# Patient Record
Sex: Male | Born: 1975 | Race: White | Hispanic: No | Marital: Married | Smoking: Never smoker
Health system: Southern US, Community
[De-identification: ages and names within clinical notes are randomized; demographics above are authoritative.]

---

## 2007-04-07 ENCOUNTER — Ambulatory Visit: Payer: Self-pay | Admitting: Internal Medicine

## 2008-01-03 ENCOUNTER — Emergency Department: Payer: Self-pay | Admitting: Emergency Medicine

## 2008-01-03 ENCOUNTER — Other Ambulatory Visit: Payer: Self-pay

## 2008-01-14 ENCOUNTER — Emergency Department: Payer: Self-pay | Admitting: Internal Medicine

## 2008-11-20 IMAGING — CT CT STONE STUDY
1 of 2 series · 16 of 32 positions shown, 20 images · non-contrast
Comparison: none

REASON FOR EXAM: l flank pain
COMMENTS:

[Series 2: stone · axial · 0.63mm/px · z∈[-754,-346]mm · 16 of 148 slices shown, 20 images]
[im 6/148  soft-tissue]
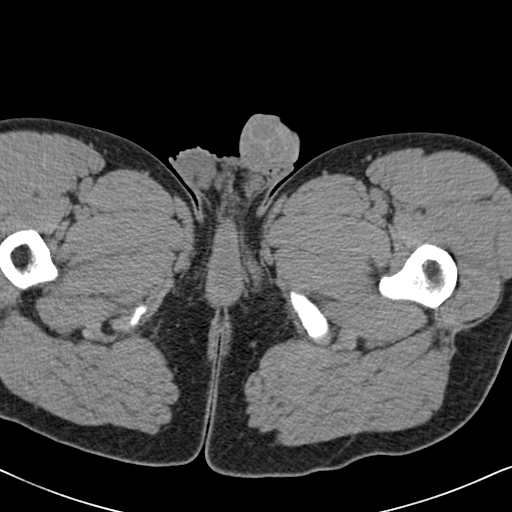
[im 6/148  bone]
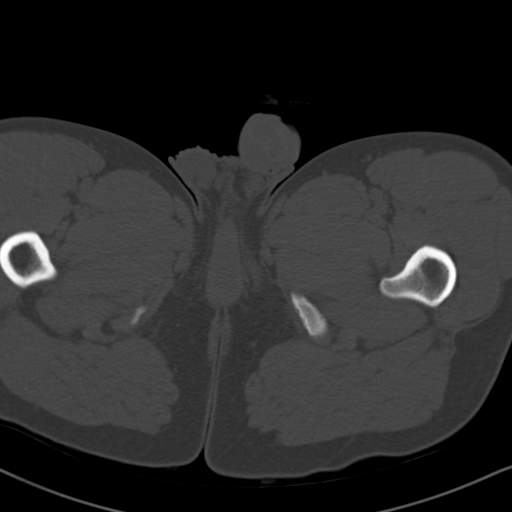
[im 17/148  soft-tissue]
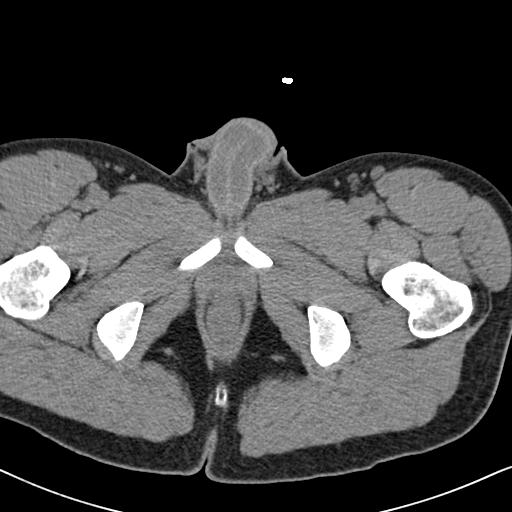
[im 28/148  soft-tissue]
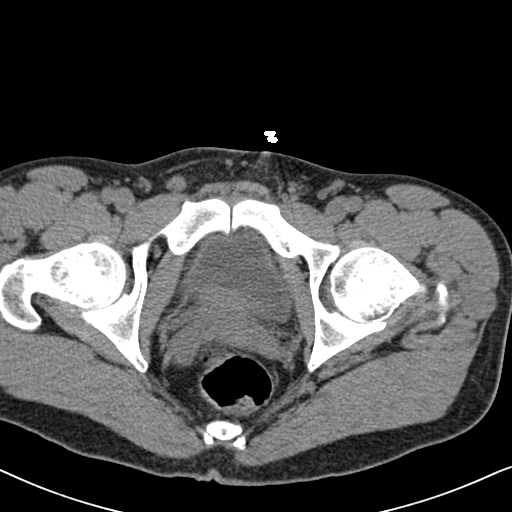
[im 39/148  soft-tissue]
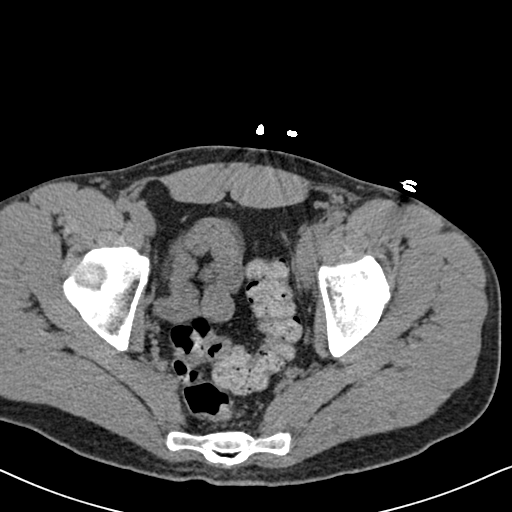
[im 50/148  soft-tissue]
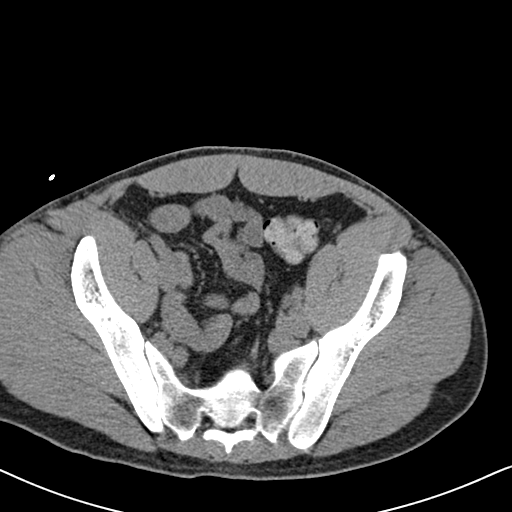
[im 60/148  soft-tissue]
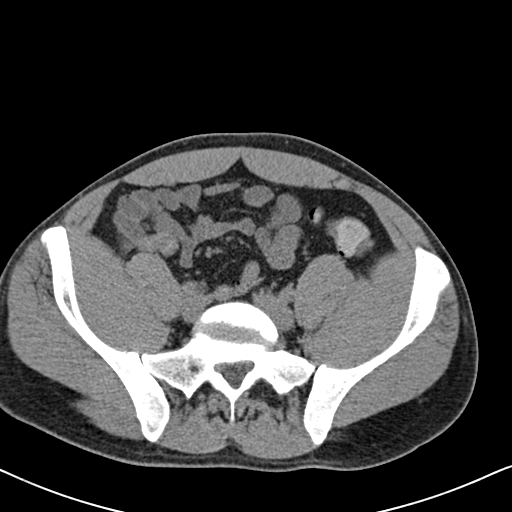
[im 71/148  soft-tissue]
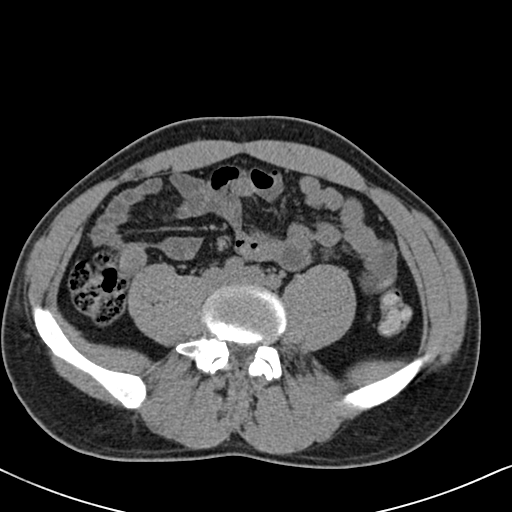
[im 77/148  soft-tissue]
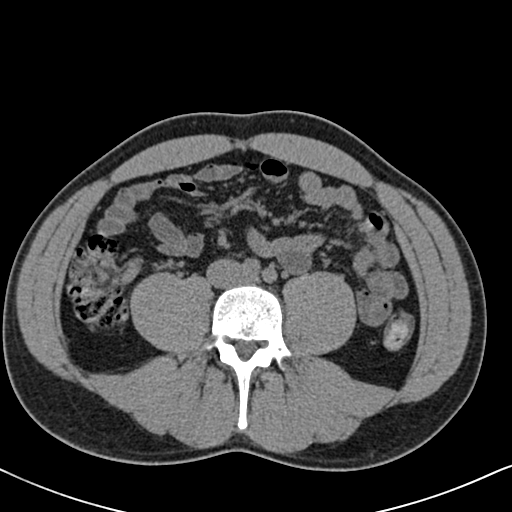
[im 88/148  soft-tissue]
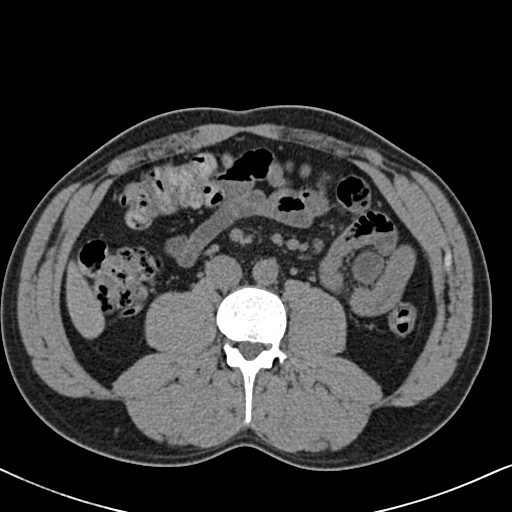
[im 88/148  bone]
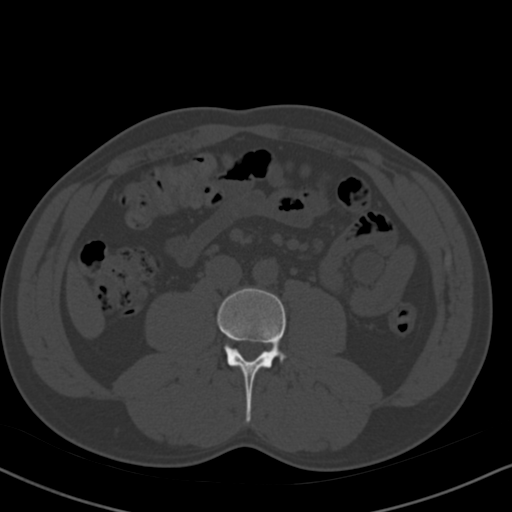
[im 99/148  soft-tissue]
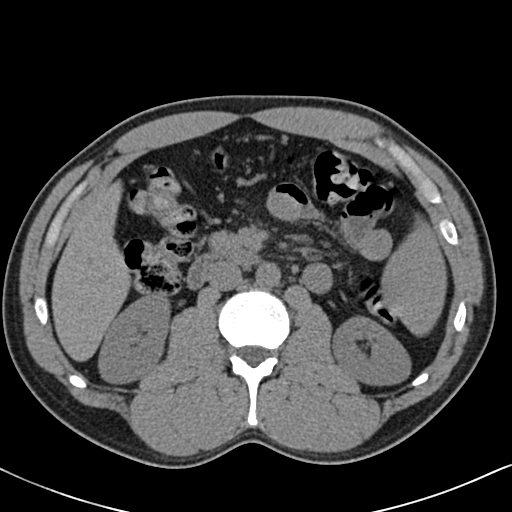
[im 109/148  soft-tissue]
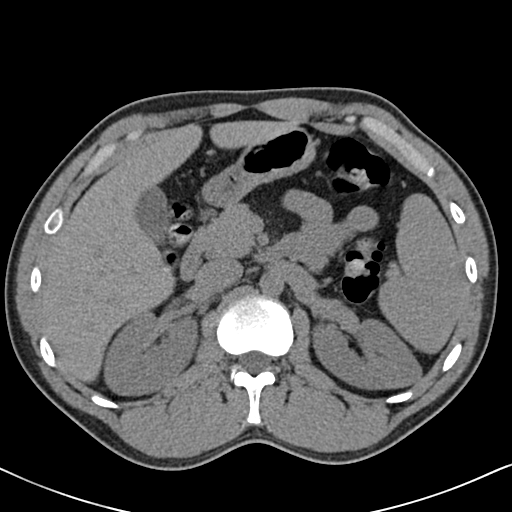
[im 120/148  soft-tissue]
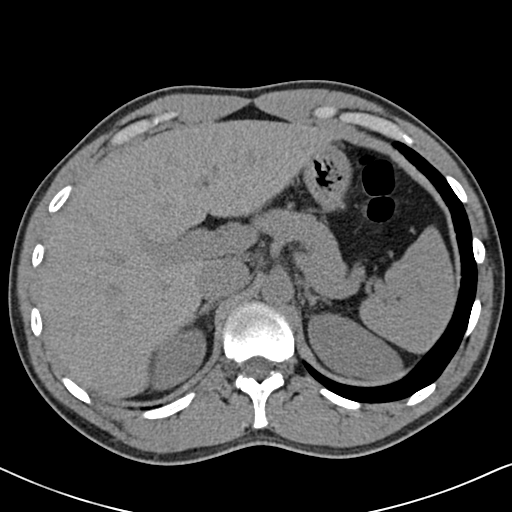
[im 126/148  lung]
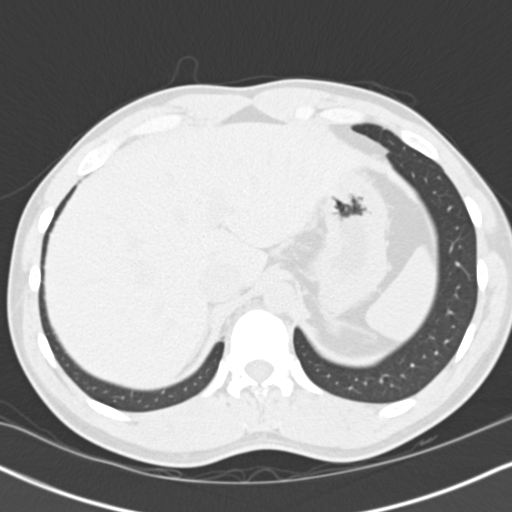
[im 131/148  soft-tissue]
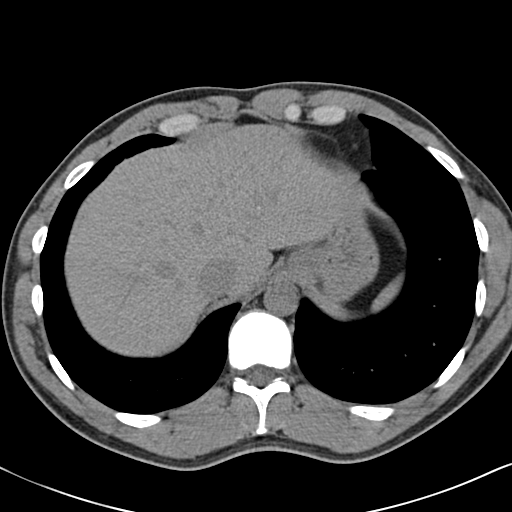
[im 131/148  lung]
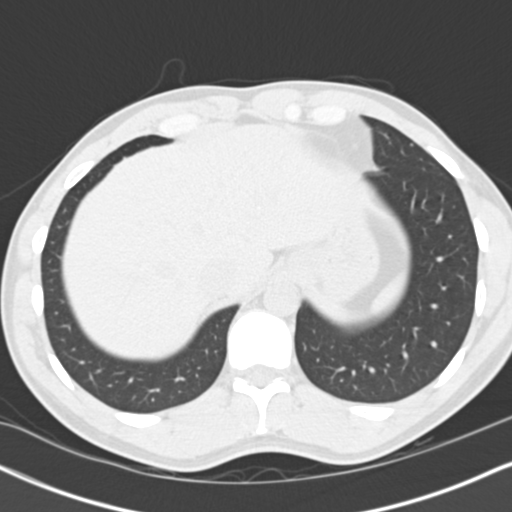
[im 137/148  lung]
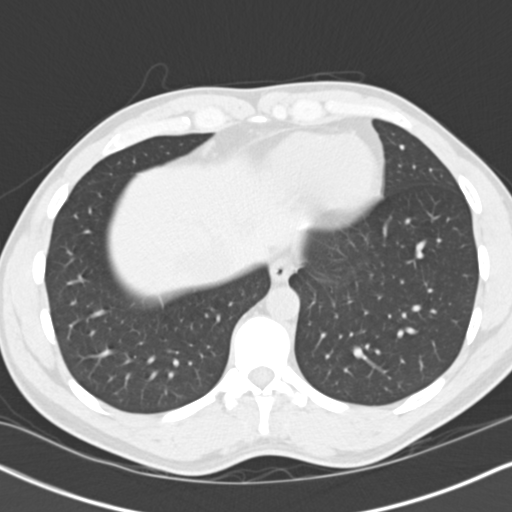
[im 142/148  soft-tissue]
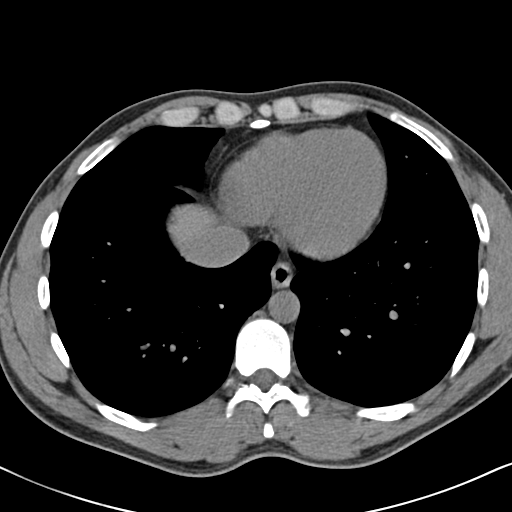
[im 142/148  lung]
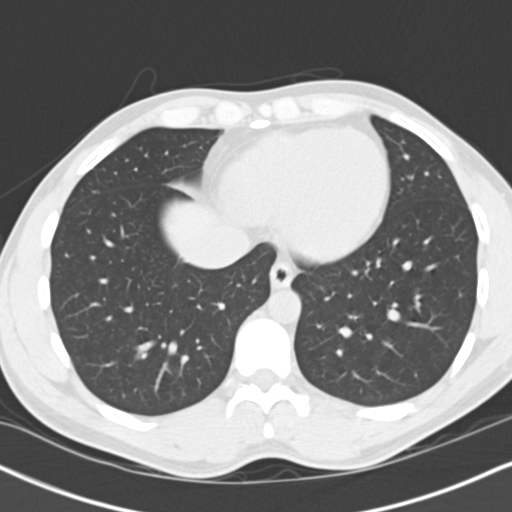

[16 of 32 positions shown; findings below may reference images not displayed]

PROCEDURE:     CT  - CT ABDOMEN /PELVIS WO (STONE)  - January 14, 2008  [DATE]

RESULT:     Noncontrast emergent CT of the abdomen and pelvis is performed.
There is no previous examination for comparison. A renal stone protocol was
followed. There is no urinary stone evident. There is no urinary
obstruction. There is no acute inflammation. The abdominal viscera appear to
be unremarkable. There is no adenopathy. The aorta is normal in caliber.
There is no abnormal bowel distention, free fluid or free air. The lung
bases are clear.
IMPRESSION: 1. No urinary stones or urinary obstruction.
2. No acute inflammation. The appendix is normal.

## 2019-08-18 ENCOUNTER — Other Ambulatory Visit: Payer: Self-pay

## 2019-08-18 ENCOUNTER — Encounter: Payer: Self-pay | Admitting: Urology

## 2019-08-18 ENCOUNTER — Ambulatory Visit (INDEPENDENT_AMBULATORY_CARE_PROVIDER_SITE_OTHER): Payer: No Typology Code available for payment source | Admitting: Urology

## 2019-08-18 VITALS — BP 140/95 | HR 101 | Ht 70.0 in | Wt 195.0 lb

## 2019-08-18 DIAGNOSIS — Z3009 Encounter for other general counseling and advice on contraception: Secondary | ICD-10-CM

## 2019-08-18 MED ORDER — DIAZEPAM 5 MG PO TABS: 5.0000 mg | ORAL_TABLET | Freq: Once | ORAL | 0 refills | Status: DC | PRN

## 2019-08-18 NOTE — Patient Instructions (Signed)
Pre-Vasectomy Instructions  STOP all aspirin or blood thinners (Aspirin, Plavix, Coumadin, Warfarin, Motrin, Ibuprofen, Advil, Aleve, Naproxen, Naprosyn) for 7 days prior to the procedure.  If you have any questions about stopping these medications please contact your primary care physician or cardiologist.  Shave all hair from the upper scrotum on the day of the procedure.  This means just under the penis onto the scrotal sac.  The area shaved should measure about 2-3 inches around.  You may lather the scrotum with soap and water, and shave with a safety razor.  After shaving the area, thoroughly wash the penis and the scrotum, then shower or bathe to remove all the loose hairs.  If needed, wash the area again just before coming in for your circumcision.  It is recommended to have a light meal an hour or so prior to the procedure.  Bring a scrotal support (jock strap or suspensory, or tight jockey shorts or underwear).  Wear comfortable pants or shorts.  While the actual procedure usually takes about 45 minutes, you should be prepared to stay in the office for approximately one hour.  Bring someone with you to drive you home.  If you have any questions or concerns, please feel free to call the office at [phone].      Vasectomy Vasectomy is a procedure in which the tube that carries sperm from the testicle to the urethra (vas deferens) is tied. It may also be cut. The procedure blocks sperm from going through the vas deferens and penis during ejaculation. This ensures that sperm does not go into the vagina during sex. Vasectomy does not affect your sexual desire or performance, and does not prevent sexually transmitted diseases. Vasectomy is considered a permanent and very effective form of birth control (contraception). The decision to have a vasectomy should not be made during a stressful situation, such as after the loss of a pregnancy or a divorce. You and your partner should make  the decision to have a vasectomy when you are sure that you do not want children in the future. Tell a health care provider about:  Any allergies you have.  All medicines you are taking, including vitamins, herbs, eye drops, creams, and over-the-counter medicines.  Any problems you or family members have had with anesthetic medicines.  Any blood disorders you have.  Any surgeries you have had.  Any medical conditions you have. What are the risks? Generally, this is a safe procedure. However, problems may occur, including:  Infection.  Bleeding and swelling of the scrotum.  Allergic reactions to medicines.  Failure of the procedure to prevent pregnancy. There is a very small chance that the cut ends of the vas deferens may reconnect (recanalization), meaning that you could still make a woman pregnant.  Pain in the scrotum that continues after healing from the procedure. What happens before the procedure?  Ask your health care provider about: ? Changing or stopping your regular medicines. This is especially important if you are taking diabetes medicines or blood thinners. ? Taking over-the-counter medicines, vitamins, herbs, and supplements. ? Taking medicines such as aspirin and ibuprofen. These medicines can thin your blood. Do not take these medicines unless your health care provider tells you to take them.  You may be asked to shower with a germ-killing soap.  Plan to have someone take you home from the hospital or clinic. What happens during the procedure?   To lower your risk of infection: ? Your health care team will   wash or sanitize their hands. ? Hair may be removed from the surgical area. ? Your scrotum will be washed with soap.  You will be given one or more of the following: ? A medicine to help you relax (sedative). You may be instructed to take this a few hours before the procedure. ? A medicine to numb the area (local anesthetic).  Your health care  provider will feel (palpate) for your vas deferens.  To reach the vas deferens, one of two methods may be used: ? A very small incision may be made in your scrotum. ? A punctured opening may be made in your scrotum, without an incision.  Your vas deferens will be pulled out of your scrotum, and may be: ? Tied off. ? Cut and possibly burned (cauterized) at the ends to seal them off.  The vas deferens will be put back into your scrotum.  The incision or puncture opening will be closed with absorbable stitches (sutures). The sutures will eventually dissolve and will not need to be removed after the procedure. The procedure may vary among health care providers and hospitals. What happens after the procedure?  You will be monitored to make sure that you do not experience problems.  You will be asked not to ejaculate for at least 1 week after the procedure, or as long as directed.  You will need to use a different form of contraception for 2-4 months after the procedure, until you have test results confirming that there are no sperm in your semen.  You may be given scrotal support to wear, such as a jock strap or underwear with a supportive pouch.  Do not drive for 24 hours if you were given a sedative to help you relax. Summary  Vasectomy is considered a permanent and very effective form of birth control (contraception). The procedure prevents sperm from being released during ejaculation.  Your scrotum will be numbed with medicine (local anesthetic) for the procedure.  After the procedure, you will be asked not to ejaculate for at least 1 week, or for as long as directed. You will also need to use a different form of contraception until your health care provider examines you and finds that there are no sperm in your semen. This information is not intended to replace advice given to you by your health care provider. Make sure you discuss any questions you have with your health care  provider. Document Released: 11/24/2002 Document Revised: 03/07/2018 Document Reviewed: 11/30/2016 Elsevier Patient Education  2020 [employer]  

## 2019-08-18 NOTE — Addendum Note (Signed)
Addended by: Billey Co on: 08/18/2019 02:30 PM   Modules accepted: Orders

## 2019-08-18 NOTE — Progress Notes (Signed)
08/18/19 9:54 AM   Andrew Khan [birth year] [id number]   CC: Discuss vasectomy  HPI: I saw Mr. Andrew Khan in urology clinic today for evaluation of vasectomy.  He is a healthy 43 year old male who does have a family history of lethal prostate cancer in his grandfather in his 79s who presents to discuss vasectomy.  He denies any other medical problems.  He has 5 children aging from newborn to 71, and he and his partner do not desire any further biologic children.   PMH: No past medical history on file.  Surgical History: None  Home Medications:  None  Allergies: No Known Allergies  Family History: Family History  Problem Relation Age of Onset  . Alcoholism Father   . Cancer Mother     Social History:  reports that he has never smoked. He has never used smokeless tobacco. He reports previous alcohol use. He reports that he does not use drugs.  ROS: Please see flowsheet from today's date for complete review of systems.  Physical Exam: BP (!) 140/95 (BP Location: Left Arm, Patient Position: Sitting, Cuff Size: Normal)   Pulse (!) 101   Ht 5\' 10"  (1.778 m)   Wt 195 lb (88.5 kg)   BMI 27.98 kg/m    Constitutional:  Alert and oriented, No acute distress. Cardiovascular: No clubbing, cyanosis, or edema. Respiratory: Normal respiratory effort, no increased work of breathing. GI: Abdomen is soft, nontender, nondistended, no abdominal masses GU: Testicles 20 cc and descended by laterally, circumcised phallus, vas deferens easily palpable bilaterally Lymph: No cervical or inguinal lymphadenopathy. Skin: No rashes, bruises or suspicious lesions. Neurologic: Grossly intact, no focal deficits, moving all 4 extremities. Psychiatric: Normal mood and affect.   Assessment & Plan:   In summary, the patient is a 43 year old healthy male with 5 children who desires surgical sterilization with vasectomy.  He also has a family history of lethal prostate cancer in his grandfather  in his 50s.  We discussed the importance of starting PSA screening at age 46 with his PCP with his family history of prostate cancer.  We discussed the risks and benefits of vasectomy at length.  Vasectomy is intended to be a permanent form of contraception, and does not produce immediate sterility.  Following vasectomy another form of contraception is required until vas occlusion is confirmed by a post-vasectomy semen analysis obtained 2-3 months after the procedure.  Even after vas occlusion is confirmed, vasectomy is not 100% reliable in preventing pregnancy, and the failure rate is approximately 09/1998.  Repeat vasectomy is required in less than 1% of patients.  He should refrain from ejaculation for 1 week after vasectomy.  Options for fertility after vasectomy include vasectomy reversal, and sperm retrieval with in vitro fertilization or ICSI.  These options are not always successful and may be expensive.  Finally, there are other permanent and non-permanent alternatives to vasectomy available. There is no risk of erectile dysfunction, and the volume of semen will be similar to prior, as the majority of the ejaculate is from the prostate and seminal vesicles.   The procedure takes ~20 minutes.  We recommend patients take 5-10 mg of Valium 30 minutes prior, and he will need a driver post-procedure.  Local anesthetic is injected into the scrotal skin and a small segment of the vas deferens is removed, and the ends occluded. The complication rate is approximately 1-2%, and includes bleeding, infection, and development of chronic scrotal pain.  PLAN: Pending insurance approval, will schedule for vasectomy  at his convenience   A total of 30 minutes were spent face-to-face with the patient, greater than 50% was spent in patient education, counseling, and coordination of care regarding risks, benefits, and alternatives to vasectomy.   Sondra Come, MD  [facility] [address] [place], [place] [address] [phone]

## 2019-09-15 ENCOUNTER — Other Ambulatory Visit: Payer: Self-pay

## 2019-09-15 ENCOUNTER — Ambulatory Visit: Payer: No Typology Code available for payment source | Admitting: Urology

## 2019-09-15 ENCOUNTER — Encounter: Payer: Self-pay | Admitting: Urology

## 2019-09-15 VITALS — BP 163/93 | HR 100 | Ht 70.0 in | Wt 195.0 lb

## 2019-09-15 DIAGNOSIS — Z308 Encounter for other contraceptive management: Secondary | ICD-10-CM

## 2019-09-15 DIAGNOSIS — Z3009 Encounter for other general counseling and advice on contraception: Secondary | ICD-10-CM | POA: Diagnosis not present

## 2019-09-15 NOTE — Progress Notes (Signed)
VASECTOMY PROCEDURE NOTE:  The patient was taken to the minor procedure room and placed in the supine position. His genitals were prepped and draped in the usual sterile fashion. The right vas deferens was brought up to the skin of the right upper scrotum. The skin overlying it was anesthetized with 1% lidocaine without epinephrine, anesthetic was also injected alongside the vas deferens in the direction of the inguinal canal. The no scalpel vasectomy instrument was used to make a small perforation in the scrotal skin. The vasectomy clamp was used to grasp the vas deferens. It was carefully dissected free from surrounding structures. A 1cm segment of the vas was removed, and the cut ends of the mucosa were cauterized. A figure of eight suture was used to perform fascial interposition. No significant bleeding was noted. The vas deferens was returned to the scrotum. The skin incision was closed with a simple interrupted stitch of 4-0 chromic.  Attention was then turned to the left side. The left vasectomy was performed in the same exact fashion. Sterile dressings were placed over each incision. The patient tolerated the procedure well.  IMPRESSION/DIAGNOSIS: The patient is a 43 year old gentleman who underwent a vasectomy today. Post-procedure instructions were reviewed. I stressed the importance of continuing to use birth control until he provides a semen specimen more than 2 months from now that demonstrates azoospermia.  We discussed return precautions including fever over 101, significant bleeding or hematoma, or uncontrolled pain. I also stressed the importance of avoiding strenuous activity for one week, no sexual activity or ejaculations for 5 days, intermittent icing over the next 48 hours, and scrotal support.   PLAN: The patient will be advised of his semen analysis results when available.  Nickolas Madrid, MD 09/15/2019

## 2019-09-15 NOTE — Patient Instructions (Signed)

## 2019-09-15 NOTE — Addendum Note (Signed)
Addended by: Verlene Mayer A on: 09/15/2019 03:57 PM   Modules accepted: Orders

## 2019-12-14 ENCOUNTER — Encounter: Payer: Self-pay | Admitting: Urology

## 2019-12-14 ENCOUNTER — Other Ambulatory Visit: Payer: No Typology Code available for payment source

## 2024-02-13 ENCOUNTER — Emergency Department

## 2024-02-13 ENCOUNTER — Emergency Department
Admission: EM | Admit: 2024-02-13 | Discharge: 2024-02-13 | Disposition: A | Discharge status: Home / Self Care | Attending: Emergency Medicine | Admitting: Emergency Medicine

## 2024-02-13 ENCOUNTER — Other Ambulatory Visit: Payer: Self-pay

## 2024-02-13 DIAGNOSIS — S3992XA Unspecified injury of lower back, initial encounter: Secondary | ICD-10-CM | POA: Diagnosis present

## 2024-02-13 DIAGNOSIS — S39012A Strain of muscle, fascia and tendon of lower back, initial encounter: Secondary | ICD-10-CM | POA: Diagnosis not present

## 2024-02-13 DIAGNOSIS — X58XXXA Exposure to other specified factors, initial encounter: Secondary | ICD-10-CM | POA: Diagnosis not present

## 2024-02-13 NOTE — Discharge Instructions (Signed)
 You can take 650 mg of Tylenol and 600 mg of ibuprofen every 6 hours as needed for pain. You can use ice, heat, muscle creams and other topical pain relievers as well. I also recommend trying to massage this with a tennis ball.  Return to the ED with any worsening symptoms.   Your blood pressure was elevated today. Please follow up with primary care, information is below.  Please go to the following website to schedule new (and existing) patient appointments:   [email or web address]   The following is a list of primary care offices in the area who are accepting new patients at this time.  Please reach out to one of them directly and let them know you would like to schedule an appointment to follow up on an Emergency Department visit, and/or to establish a new primary care provider (PCP).  There are likely other primary care clinics in the are who are accepting new patients, but this is an excellent place to start:  [facility] Lead physician: Dr Aden Agreste [address] [place], [place] [address] [phone]  [facility] Lead Physician: Dr Arleen Lacer [address], [place], [place] [address] [phone]  [facility]  Lead Physician: Dr Terre Ferri [address] [place], [place] [address] [phone]  [facility] Lead Physician: Dr Audrie Blind [address] [place], [place] [address] [phone]  [facility] Lead Physician: Dr Janna Melter [address] [place], [place], [place] [address] [phone]

## 2024-02-13 NOTE — ED Provider Notes (Signed)
 [facility] Provider Note    Event Date/Time   First MD Initiated Contact with Patient 02/13/24 [phone]     (approximate)   History   Back Pain   HPI  Andrew Khan is a 48 y.o. male with no PMH who presents for evaluation of a knot under the posterior right rib.  Patient noticed it 2 days ago but has had pain since the past weekend.  He denies trauma and specific injury but states he did have to clean out his chicken coop which involves a lot of bending over.  He states that the pain does not radiate anywhere.  He does have bouts of sharp pain with specific movements that cause him to catch his breath.  No urinary symptoms.  No nausea vomiting or diarrhea.      Physical Exam   Triage Vital Signs: ED Triage Vitals  Encounter Vitals Group     BP 02/13/24 0835 (!) 174/105     Systolic BP Percentile --      Diastolic BP Percentile --      Pulse Rate 02/13/24 0835 (!) 107     Resp 02/13/24 0835 20     Temp 02/13/24 0835 98.1 F (36.7 C)     Temp src --      SpO2 02/13/24 0835 98 %     Weight 02/13/24 0839 195 lb 1.7 oz (88.5 kg)     Height 02/13/24 0839 5\' 10"  (1.778 m)     Head Circumference --      Peak Flow --      Pain Score 02/13/24 0835 5     Pain Loc --      Pain Education --      Exclude from Growth Chart --     Most recent vital signs: Vitals:   02/13/24 0835 02/13/24 0929  BP: (!) 174/105 (!) 147/96  Pulse: (!) 107 96  Resp: 20   Temp: 98.1 F (36.7 C)   SpO2: 98% 99%   General: Awake, no distress.  CV:  Good peripheral perfusion.  RRR. Resp:  Normal effort.  CTAB. Abd:  No distention.  No CVA tenderness. Other:  No tenderness to palpation over the spine.  Palpable knot in the muscle at the right posterior ribs that is tender to palpation.   ED Results / Procedures / Treatments   Labs (all labs ordered are listed, but only abnormal results are displayed) Labs Reviewed - No data to display   RADIOLOGY  Right rib  x-ray obtained, I interpreted the images as well as reviewed the radiologist report which was negative for fracture.   PROCEDURES:  Critical Care performed: No  Procedures   MEDICATIONS ORDERED IN ED: Medications - No data to display   IMPRESSION / MDM / ASSESSMENT AND PLAN / ED COURSE  I reviewed the triage vital signs and the nursing notes.                             48 year old male presents for evaluation of a knot in the right lower ribs.  Pressure is slightly elevated but suspect this is due to pain.  Vital signs stable otherwise.  Differential diagnosis includes, but is not limited to, rib fracture, muscle strain, less likely pyelonephritis, nephrolithiasis.  Patient's presentation is most consistent with acute complicated illness / injury requiring diagnostic workup.  Suspect that patient has a pulled muscle as there is a  palpable knot over the right posterior ribs. Will obtain an xray to rule out a rib fracture. Do not feel this pain is consistent with a kidney stone. Patient does not have any CVA tenderness, no urinary symptoms or fever. Pain is worse with specific movements.   Xray is negative. Discussed OTC pain reliever and topical pain relief with the patient. He voiced understanding, all questions were answered and he was stable at discharge.      FINAL CLINICAL IMPRESSION(S) / ED DIAGNOSES   Final diagnoses:  Strain of lumbar region, initial encounter     Rx / DC Orders   ED Discharge Orders     None        Note:  This document was prepared using Dragon voice recognition software and may include unintentional dictation errors.   Phyliss Breen, PA-C 02/13/24 1030    Claria Crofts, MD 02/13/24 [phone]

## 2024-02-13 NOTE — ED Triage Notes (Addendum)
 Pt to ED via POV from home. Pt ambulatory to triage. Pt reports noticing a knot under posterior right rib cage x2 days. Pain is non-radiating. No urinary symptoms. Denies N/V/D.  No medical hx

## 2024-02-13 NOTE — ED Notes (Signed)
 See triage note  Presents with right flank area  States pain started this past weekend States pain is non radiating No n/v/d/ or fever States pain is better with rest   Ambulates well to treatment room

## 2024-02-20 ENCOUNTER — Encounter: Payer: Self-pay | Admitting: Emergency Medicine

## 2024-02-20 ENCOUNTER — Emergency Department

## 2024-02-20 ENCOUNTER — Other Ambulatory Visit: Payer: Self-pay

## 2024-02-20 ENCOUNTER — Inpatient Hospital Stay
Admission: EM | Admit: 2024-02-20 | Discharge: 2024-03-03 | DRG: 871 | Disposition: A | Discharge status: Home / Self Care | Attending: Internal Medicine | Admitting: Internal Medicine

## 2024-02-20 ENCOUNTER — Observation Stay

## 2024-02-20 DIAGNOSIS — E876 Hypokalemia: Secondary | ICD-10-CM | POA: Diagnosis present

## 2024-02-20 DIAGNOSIS — R0789 Other chest pain: Secondary | ICD-10-CM | POA: Diagnosis not present

## 2024-02-20 DIAGNOSIS — R6520 Severe sepsis without septic shock: Secondary | ICD-10-CM | POA: Diagnosis present

## 2024-02-20 DIAGNOSIS — E872 Acidosis, unspecified: Secondary | ICD-10-CM | POA: Diagnosis present

## 2024-02-20 DIAGNOSIS — J9811 Atelectasis: Secondary | ICD-10-CM | POA: Diagnosis present

## 2024-02-20 DIAGNOSIS — R17 Unspecified jaundice: Secondary | ICD-10-CM

## 2024-02-20 DIAGNOSIS — J189 Pneumonia, unspecified organism: Secondary | ICD-10-CM | POA: Diagnosis present

## 2024-02-20 DIAGNOSIS — T3695XA Adverse effect of unspecified systemic antibiotic, initial encounter: Secondary | ICD-10-CM | POA: Diagnosis not present

## 2024-02-20 DIAGNOSIS — K650 Generalized (acute) peritonitis: Secondary | ICD-10-CM

## 2024-02-20 DIAGNOSIS — J918 Pleural effusion in other conditions classified elsewhere: Secondary | ICD-10-CM | POA: Diagnosis present

## 2024-02-20 DIAGNOSIS — Z6830 Body mass index (BMI) 30.0-30.9, adult: Secondary | ICD-10-CM

## 2024-02-20 DIAGNOSIS — M898X8 Other specified disorders of bone, other site: Secondary | ICD-10-CM | POA: Diagnosis present

## 2024-02-20 DIAGNOSIS — R188 Other ascites: Secondary | ICD-10-CM

## 2024-02-20 DIAGNOSIS — Z9889 Other specified postprocedural states: Secondary | ICD-10-CM

## 2024-02-20 DIAGNOSIS — J869 Pyothorax without fistula: Secondary | ICD-10-CM | POA: Diagnosis present

## 2024-02-20 DIAGNOSIS — J9601 Acute respiratory failure with hypoxia: Secondary | ICD-10-CM | POA: Diagnosis present

## 2024-02-20 DIAGNOSIS — R197 Diarrhea, unspecified: Secondary | ICD-10-CM | POA: Diagnosis not present

## 2024-02-20 DIAGNOSIS — E66811 Obesity, class 1: Secondary | ICD-10-CM | POA: Diagnosis present

## 2024-02-20 DIAGNOSIS — I10 Essential (primary) hypertension: Secondary | ICD-10-CM | POA: Diagnosis present

## 2024-02-20 DIAGNOSIS — E871 Hypo-osmolality and hyponatremia: Secondary | ICD-10-CM | POA: Diagnosis present

## 2024-02-20 DIAGNOSIS — J90 Pleural effusion, not elsewhere classified: Secondary | ICD-10-CM

## 2024-02-20 DIAGNOSIS — I160 Hypertensive urgency: Secondary | ICD-10-CM

## 2024-02-20 DIAGNOSIS — Y92230 Patient room in hospital as the place of occurrence of the external cause: Secondary | ICD-10-CM | POA: Diagnosis not present

## 2024-02-20 DIAGNOSIS — K521 Toxic gastroenteritis and colitis: Secondary | ICD-10-CM | POA: Diagnosis not present

## 2024-02-20 DIAGNOSIS — A419 Sepsis, unspecified organism: Principal | ICD-10-CM | POA: Diagnosis present

## 2024-02-20 DIAGNOSIS — R739 Hyperglycemia, unspecified: Secondary | ICD-10-CM

## 2024-02-20 DIAGNOSIS — Z1152 Encounter for screening for COVID-19: Secondary | ICD-10-CM

## 2024-02-20 DIAGNOSIS — R079 Chest pain, unspecified: Secondary | ICD-10-CM

## 2024-02-20 DIAGNOSIS — K760 Fatty (change of) liver, not elsewhere classified: Secondary | ICD-10-CM | POA: Diagnosis present

## 2024-02-20 DIAGNOSIS — R7303 Prediabetes: Secondary | ICD-10-CM | POA: Diagnosis present

## 2024-02-20 DIAGNOSIS — K750 Abscess of liver: Secondary | ICD-10-CM | POA: Diagnosis present

## 2024-02-20 LAB — LACTIC ACID, PLASMA
Lactic Acid, Venous: 2.4 mmol/L (ref 0.5–1.9)
Lactic Acid, Venous: 2.6 mmol/L (ref 0.5–1.9)

## 2024-02-20 LAB — BASIC METABOLIC PANEL WITH GFR
Anion gap: 13 (ref 5–15)
BUN: 12 mg/dL (ref 6–20)
CO2: 23 mmol/L (ref 22–32)
Calcium: 8.9 mg/dL (ref 8.9–10.3)
Chloride: 95 mmol/L — ABNORMAL LOW (ref 98–111)
Creatinine, Ser: 1.17 mg/dL (ref 0.61–1.24)
GFR, Estimated: >60 mL/min (ref >60)
Glucose, Bld: 218 mg/dL — ABNORMAL HIGH (ref 70–99)
Potassium: 3.7 mmol/L (ref 3.5–5.1)
Sodium: 131 mmol/L — ABNORMAL LOW (ref 135–145)

## 2024-02-20 LAB — RESP PANEL BY RT-PCR (RSV, FLU A&B, COVID)  RVPGX2
Influenza A by PCR: NEGATIVE
Influenza B by PCR: NEGATIVE
Resp Syncytial Virus by PCR: NEGATIVE
SARS Coronavirus 2 by RT PCR: NEGATIVE

## 2024-02-20 LAB — CBC
HCT: 46.4 % (ref 39.0–52.0)
Hemoglobin: 15.7 g/dL (ref 13.0–17.0)
MCH: 28.3 pg (ref 26.0–34.0)
MCHC: 33.8 g/dL (ref 30.0–36.0)
MCV: 83.6 fL (ref 80.0–100.0)
Platelets: 260 10*3/uL (ref 150–400)
RBC: 5.55 MIL/uL (ref 4.22–5.81)
RDW: 11.9 % (ref 11.5–15.5)
WBC: 21.5 10*3/uL — ABNORMAL HIGH (ref 4.0–10.5)
nRBC: 0 % (ref 0.0–0.2)

## 2024-02-20 LAB — HIV ANTIBODY (ROUTINE TESTING W REFLEX): HIV Screen 4th Generation wRfx: NONREACTIVE

## 2024-02-20 LAB — TROPONIN I (HIGH SENSITIVITY)
Troponin I (High Sensitivity): 7 ng/L (ref <18)
Troponin I (High Sensitivity): 10 ng/L (ref <18)

## 2024-02-20 LAB — HEPATIC FUNCTION PANEL
ALT: 36 U/L (ref 0–44)
AST: 30 U/L (ref 15–41)
Albumin: 4.2 g/dL (ref 3.5–5.0)
Alkaline Phosphatase: 67 U/L (ref 38–126)
Bilirubin, Direct: 0.3 mg/dL — ABNORMAL HIGH (ref 0.0–0.2)
Indirect Bilirubin: 1.1 mg/dL — ABNORMAL HIGH (ref 0.3–0.9)
Total Bilirubin: 1.4 mg/dL — ABNORMAL HIGH (ref 0.0–1.2)
Total Protein: 8.7 g/dL — ABNORMAL HIGH (ref 6.5–8.1)

## 2024-02-20 LAB — MAGNESIUM: Magnesium: 1.8 mg/dL (ref 1.7–2.4)

## 2024-02-20 LAB — LIPASE, BLOOD: Lipase: 31 U/L (ref 11–51)

## 2024-02-20 LAB — T4, FREE: Free T4: 1.03 ng/dL (ref 0.61–1.12)

## 2024-02-20 LAB — BRAIN NATRIURETIC PEPTIDE: B Natriuretic Peptide: 41 pg/mL (ref 0.0–100.0)

## 2024-02-20 LAB — TSH: TSH: 0.975 u[IU]/mL (ref 0.350–4.500)

## 2024-02-20 LAB — D-DIMER, QUANTITATIVE: D-Dimer, Quant: 3.11 ug{FEU}/mL — ABNORMAL HIGH (ref 0.00–0.50)

## 2024-02-20 MED ORDER — LACTATED RINGERS IV BOLUS
1000.0000 mL | Freq: Once | INTRAVENOUS | Status: AC
  Administered 2024-02-20: 1000 mL via INTRAVENOUS

## 2024-02-20 MED ORDER — GUAIFENESIN ER 600 MG PO TB12
600.0000 mg | ORAL_TABLET | Freq: Two times a day (BID) | ORAL | Status: DC
  Administered 2024-02-20 – 2024-03-03 (×23): 600 mg via ORAL
  Filled 2024-02-20 (×23): qty 1

## 2024-02-20 MED ORDER — IOHEXOL 350 MG/ML SOLN
100.0000 mL | Freq: Once | INTRAVENOUS | Status: AC | PRN
  Administered 2024-02-20: 100 mL via INTRAVENOUS

## 2024-02-20 MED ORDER — ACETAMINOPHEN 325 MG PO TABS
650.0000 mg | ORAL_TABLET | Freq: Four times a day (QID) | ORAL | Status: AC | PRN
Start: 2024-02-20 — End: ?
  Administered 2024-02-21 – 2024-02-29 (×6): 650 mg via ORAL
  Filled 2024-02-20 (×6): qty 2

## 2024-02-20 MED ORDER — ALBUTEROL SULFATE (2.5 MG/3ML) 0.083% IN NEBU
2.5000 mg | INHALATION_SOLUTION | RESPIRATORY_TRACT | Status: AC | PRN
Start: 2024-02-20 — End: ?
  Administered 2024-02-24 (×2): 2.5 mg via RESPIRATORY_TRACT
  Filled 2024-02-20 (×2): qty 3

## 2024-02-20 MED ORDER — LACTATED RINGERS IV SOLN
150.0000 mL/h | INTRAVENOUS | Status: AC
  Administered 2024-02-20 – 2024-02-21 (×3): 150 mL/h via INTRAVENOUS

## 2024-02-20 MED ORDER — LIDOCAINE 5 % EX PTCH
1.0000 | MEDICATED_PATCH | CUTANEOUS | Status: DC
  Administered 2024-02-20 – 2024-02-25 (×5): 1 via TRANSDERMAL
  Filled 2024-02-20 (×7): qty 1

## 2024-02-20 MED ORDER — SODIUM CHLORIDE 0.9 % IV SOLN
500.0000 mg | INTRAVENOUS | Status: DC
  Administered 2024-02-21 – 2024-02-23 (×3): 500 mg via INTRAVENOUS
  Filled 2024-02-20 (×3): qty 5

## 2024-02-20 MED ORDER — HYDROCODONE-ACETAMINOPHEN 5-325 MG PO TABS
1.0000 | ORAL_TABLET | ORAL | Status: DC | PRN
  Administered 2024-02-20 – 2024-02-21 (×3): 1 via ORAL
  Administered 2024-02-22 – 2024-02-25 (×7): 2 via ORAL
  Administered 2024-02-25: 1 via ORAL
  Administered 2024-02-26 – 2024-02-28 (×9): 2 via ORAL
  Administered 2024-02-29 (×2): 1 via ORAL
  Administered 2024-03-01: 2 via ORAL
  Filled 2024-02-20: qty 2
  Filled 2024-02-20: qty 1
  Filled 2024-02-20 (×2): qty 2
  Filled 2024-02-20: qty 1
  Filled 2024-02-20 (×3): qty 2
  Filled 2024-02-20: qty 1
  Filled 2024-02-20: qty 2
  Filled 2024-02-20: qty 1
  Filled 2024-02-20 (×4): qty 2
  Filled 2024-02-20: qty 1
  Filled 2024-02-20: qty 2
  Filled 2024-02-20: qty 1
  Filled 2024-02-20 (×7): qty 2

## 2024-02-20 MED ORDER — ACETAMINOPHEN 650 MG RE SUPP: 650.0000 mg | Freq: Four times a day (QID) | RECTAL | Status: DC | PRN

## 2024-02-20 MED ORDER — SODIUM CHLORIDE 0.9 % IV SOLN
2.0000 g | Freq: Once | INTRAVENOUS | Status: AC
  Administered 2024-02-20: 2 g via INTRAVENOUS
  Filled 2024-02-20: qty 20

## 2024-02-20 MED ORDER — ONDANSETRON HCL 4 MG PO TABS
4.0000 mg | ORAL_TABLET | Freq: Four times a day (QID) | ORAL | Status: AC | PRN
Start: 2024-02-20 — End: ?

## 2024-02-20 MED ORDER — SODIUM CHLORIDE 0.9 % IV BOLUS
1000.0000 mL | Freq: Once | INTRAVENOUS | Status: AC
  Administered 2024-02-20: 1000 mL via INTRAVENOUS

## 2024-02-20 MED ORDER — MORPHINE SULFATE (PF) 4 MG/ML IV SOLN
6.0000 mg | Freq: Once | INTRAVENOUS | Status: AC
  Administered 2024-02-20: 6 mg via INTRAVENOUS
  Filled 2024-02-20: qty 2

## 2024-02-20 MED ORDER — ENOXAPARIN SODIUM 40 MG/0.4ML IJ SOSY
40.0000 mg | PREFILLED_SYRINGE | INTRAMUSCULAR | Status: DC
  Administered 2024-02-20 – 2024-03-02 (×11): 40 mg via SUBCUTANEOUS
  Filled 2024-02-20 (×12): qty 0.4

## 2024-02-20 MED ORDER — KETOROLAC TROMETHAMINE 30 MG/ML IJ SOLN
30.0000 mg | Freq: Four times a day (QID) | INTRAMUSCULAR | Status: AC | PRN
  Administered 2024-02-21 – 2024-02-25 (×7): 30 mg via INTRAVENOUS
  Filled 2024-02-20 (×7): qty 1

## 2024-02-20 MED ORDER — SODIUM CHLORIDE 0.9 % IV SOLN
500.0000 mg | Freq: Once | INTRAVENOUS | Status: AC
  Administered 2024-02-20: 500 mg via INTRAVENOUS
  Filled 2024-02-20: qty 5

## 2024-02-20 MED ORDER — ONDANSETRON HCL 4 MG/2ML IJ SOLN: 4.0000 mg | Freq: Four times a day (QID) | INTRAMUSCULAR | Status: DC | PRN

## 2024-02-20 MED ORDER — ONDANSETRON HCL 4 MG/2ML IJ SOLN
4.0000 mg | Freq: Once | INTRAMUSCULAR | Status: AC
  Administered 2024-02-20: 4 mg via INTRAVENOUS
  Filled 2024-02-20: qty 2

## 2024-02-20 MED ORDER — SODIUM CHLORIDE 0.9 % IV SOLN
2.0000 g | INTRAVENOUS | Status: DC
  Administered 2024-02-21 – 2024-02-22 (×2): 2 g via INTRAVENOUS
  Filled 2024-02-20 (×2): qty 20

## 2024-02-20 NOTE — ED Notes (Signed)
Funke MD at bedside

## 2024-02-20 NOTE — Assessment & Plan Note (Addendum)
 Sepsis Acute respiratory failure with hypoxia Sepsis criteria include tachycardia and tachypnea with leukocytosis and lactic acidosis Patient desatted to 88% following admission and was placed on O2 at 2 L Rocephin and azithromycin Continue sepsis fluids.  Received bolus in the ED Follow cultures

## 2024-02-20 NOTE — ED Provider Notes (Signed)
 [facility] Provider Note    Event Date/Time   First MD Initiated Contact with Patient 02/20/24 1641     (approximate)   History   Chest Pain   HPI  Andrew Khan is a 48 y.o. male is otherwise healthy not on any medications who comes in with concerns for right sided lower chest pain.  Patient reports that about an hour before coming in he had sudden onset of pain radiating to his right flank area.  He reports a little bit of shortness of breath but states it is more just from the pain.  He denies any risk factors for pulmonary embolism.  He denies any numbness, tingling.  He denies any new swelling in his legs.  He denies any excessive caffeine use, alcohol, drugs, weight loss medications, testosterone.  He reports that his pain is severe.  Patient reports that he had a few weeks where he thought he had injured one of his ribs and he was actually seen a chiropractor and last saw them about a week ago and to have some manipulations done on this area.  Physical Exam   Triage Vital Signs: ED Triage Vitals  Encounter Vitals Group     BP      Systolic BP Percentile      Diastolic BP Percentile      Pulse      Resp      Temp      Temp src      SpO2      Weight      Height      Head Circumference      Peak Flow      Pain Score      Pain Loc      Pain Education      Exclude from Growth Chart     Most recent vital signs: Vitals:   02/20/24 1655 02/20/24 1700  BP: (!) 173/114   Pulse: (!) 124 (!) 121  Resp: (!) 39 (!) 36  Temp:    SpO2: 96% 95%     General: Awake, no distress.  CV:  Good peripheral perfusion.  Tachycardic.  No murmur Resp:  Normal effort.  Clear lungs Abd:  No distention.  Other:  Patient is diaphoretic.  Equal radial pulses equal DP pulses.  Abdomen appears soft and nontender but he reports pain in his right lower chest wall area. Equal sensation throughout  ED Results / Procedures / Treatments   Labs (all labs  ordered are listed, but only abnormal results are displayed) Labs Reviewed  BASIC METABOLIC PANEL WITH GFR - Abnormal; Notable for the following components:      Result Value   Sodium 131 (*)    Chloride 95 (*)    Glucose, Bld 218 (*)    All other components within normal limits  CBC - Abnormal; Notable for the following components:   WBC 21.5 (*)    All other components within normal limits  HEPATIC FUNCTION PANEL - Abnormal; Notable for the following components:   Total Protein 8.7 (*)    Total Bilirubin 1.4 (*)    Bilirubin, Direct 0.3 (*)    Indirect Bilirubin 1.1 (*)    All other components within normal limits  LIPASE, BLOOD  MAGNESIUM  D-DIMER, QUANTITATIVE  TSH  T4, FREE  BRAIN NATRIURETIC PEPTIDE  TROPONIN I (HIGH SENSITIVITY)     EKG  My interpretation of EKG:  Rate of 139 without any ST elevation  or T wave inversion however in lead III, no bundle-branch block  Sinus tachycardia rate 135 without any ST elevation or T wave inversions, normal intervals  RADIOLOGY I have reviewed the xray personally and interpreted and possible atelectasis versus infiltrate in the lung base   PROCEDURES:  Critical Care performed: Yes, see critical care procedure note(s)  .1-3 Lead EKG Interpretation  Performed by: Lubertha Rush, MD Authorized by: Lubertha Rush, MD     Interpretation: abnormal     ECG rate:  120   ECG rate assessment: tachycardic     Rhythm: sinus tachycardia     Ectopy: none     Conduction: normal   .Critical Care  Performed by: Lubertha Rush, MD Authorized by: Lubertha Rush, MD   Critical care provider statement:    Critical care time (minutes):  30   Critical care was necessary to treat or prevent imminent or life-threatening deterioration of the following conditions:  Sepsis   Critical care was time spent personally by me on the following activities:  Development of treatment plan with patient or surrogate, discussions with consultants, evaluation  of patient's response to treatment, examination of patient, ordering and review of laboratory studies, ordering and review of radiographic studies, ordering and performing treatments and interventions, pulse oximetry, re-evaluation of patient's condition and review of old charts    MEDICATIONS ORDERED IN ED: Medications  lidocaine (LIDODERM) 5 % 1 patch (1 patch Transdermal Patch Applied 02/20/24 1734)  morphine (PF) 4 MG/ML injection 6 mg (6 mg Intravenous Given 02/20/24 1651)  ondansetron (ZOFRAN) injection 4 mg (4 mg Intravenous Given 02/20/24 1654)  sodium chloride 0.9 % bolus 1,000 mL (1,000 mLs Intravenous New Bag/Given 02/20/24 1648)     IMPRESSION / MDM / ASSESSMENT AND PLAN / ED COURSE  I reviewed the triage vital signs and the nursing notes.   Patient's presentation is most consistent with acute presentation with potential threat to life or bodily function.    Patient comes in with tachycardia afebrile with concerns for right-sided rib pain.  Cardiac markers ordered evaluate for ACS, D-dimer ordered evaluate for PE.  Consider the possibility of dissection but patient has equal radial pulses no murmur, no numbness, no tingling   5:07 PM Heart rate has come down to 120 with medications.   Patient is lipase is normal.  BMP shows some elevated glucose slightly low sodium and chloride.  White count is significantly elevated at 21,000.  Troponin is negative.  LFTs slightly elevated but overall reassuring and I do not believe this represents gallbladder pathology such as choledocholithiasis.  At this time with his x-rays showing possible infiltrate versus atelectasis I am more concerned about the possibility of pneumonia versus pulmonary embolism.  Patient meets sepsis criteria and therefore I will start broad-spectrum antibiotics as well as additional fluid for patient's tachycardia.  I do think patient would benefit from CT scan with contrast to better evaluate what is going on in his lungs to  see if there is an abscess there or a blood clot given significant tachycardia.  Will proceed with CT PE.  IMPRESSION: 1. No evidence of significant pulmonary embolus. 2. Infiltration or atelectasis in the lung bases. Small right pleural effusion. 3. Small esophageal hiatal hernia. 4. Minimal ascites around the liver edge.     CT imaging concerning for possible pneumonia.  No evidence of PE.  Troponins are flat no signs of ACS.  Will discuss with the hospital team for admission.  Patient  started on antibiotics for pneumonia.  There was some concern of some possible ascites on his CT scan.  He is nontender in his abdomen and I have ordered an ultrasound to further evaluate this.  This is pending at time of disposition.  I did alert the hospitalist that had resulted with a little bit of free fluid.  This is a little bit odd given he was nontender in his abdomen not sure if this is relevant and patient would need a CT scan.  I did message the hospitalist so they are aware to see if patient would require CT imaging but upon my initial assessment he had no pain in his abdomen.   The patient is on the cardiac monitor to evaluate for evidence of arrhythmia and/or significant heart rate changes.  Clinical Course as of 02/20/24 2127  Thu Feb 20, 2024  1954 Troponin I (High Sensitivity) [HD]    Clinical Course User Index [HD] Lanetta Pion, MD     FINAL CLINICAL IMPRESSION(S) / ED DIAGNOSES   Final diagnoses:  Sepsis, due to unspecified organism, unspecified whether acute organ dysfunction present [facility])  Pneumonia of right lung due to infectious organism, unspecified part of lung     Rx / DC Orders   ED Discharge Orders     None        Note:  This document was prepared using Dragon voice recognition software and may include unintentional dictation errors.   Lubertha Rush, MD 02/20/24 2131

## 2024-02-20 NOTE — Assessment & Plan Note (Signed)
 Right-sided chest pain, likely pleuritic pain related to developing pneumonia Troponin negative x 2 and EKG nonacute Pain control Continue treatment of pneumonia as above

## 2024-02-20 NOTE — Progress Notes (Signed)
 Elink following for sepsis protocol.

## 2024-02-20 NOTE — Progress Notes (Signed)
 CODE SEPSIS - PHARMACY COMMUNICATION  **Broad Spectrum Antibiotics should be administered within 1 hour of Sepsis diagnosis**  Time Code Sepsis Called/Page Received: 1807  Antibiotics Ordered: Ceftriaxone, azithro  Time of 1st antibiotic administration: 1846  Additional action taken by pharmacy: Messaged RN @ [phone] about time left to give antibiotics w/in 1 hour Code Sepsis- Per RN patient in CT. Antibiotics to be given as soon as he returns.   If necessary, Name of Provider/Nurse Contacted: Deberah Falconer, RN   Malone Sear, PharmD, BCPS Clinical Pharmacist 02/20/2024 6:10 PM

## 2024-02-20 NOTE — ED Notes (Signed)
 Patient transported to CT

## 2024-02-20 NOTE — Assessment & Plan Note (Signed)
 No prior diagnosis of hypertension but BP 181/113 in the ED Will treat with hydralazine IV as needed hypertensives if uncontrolled following pain control

## 2024-02-20 NOTE — ED Triage Notes (Signed)
 Patient to ED via Pov for right sided CP that radiates into back. Started approx 1 hr ago. PT c/o SOB. Pt sweating profusely. Denies cardiac hx.

## 2024-02-20 NOTE — H&P (Signed)
 History and Physical    Patient: Andrew Khan YQI:[id number] DOB: 21-Mar-1976 DOA: 02/20/2024 DOS: the patient was seen and examined on 02/20/2024 PCP: Patient, No Pcp Per  Patient coming from: Home  Chief Complaint:  Chief Complaint  Patient presents with   Chest Pain    HPI: Andrew Khan is a 48 y.o. male with no significant past medical history presenting with acute onset shortness of breath, preceded by 2 weeks of intermittent right-sided chest pain.  Patient had been seeing a chiropractor for possible diagnosis of a slipped rib that was causing the chest pain.  On the day of arrival he had sudden onset shortness of breath associated with right-sided lower anterior chest pain and diaphoresis.  He had no nausea, vomiting or lightheadedness.  He denied lower extremity pain or swelling and no recent travel. ED course and data review: Upon arrival, BP 181/113 with was tachycardic to the 130s and tachypneic to the high 30s with O2 sat on room air.  Afebrile. Labs notable for WBC 21,500 with lactic acid 2.4 D-dimer 3.11, troponin 7-->10, BNP 41 BMP notable for elevated glucose of 218 with no prior history of diabetes. LFTs notable for mildly elevated total bilirubin of 1.4.  Lipase normal at 31  EKG, personally interpreted showing sinus tachycardia at 1 with no acute ST-T wave changes.    CTA chest negative for PE but showing infiltration at right pleural effusion as well as minimal ascites around the liver edge.  RUQ ultrasound ordered and pending  Patient treated with sepsis fluid bolus started on Rocephin and azithromycin and given morphine for pain  Hospitalist consulted for admission.  Following admission, O2 sats dipped to 88% and patient was started on O2 at 2 L     Review of Systems: As mentioned in the history of present illness. All other systems reviewed and are negative.  History reviewed. No pertinent past medical history. History reviewed. No pertinent surgical  history. Social History:  reports that he has never smoked. He has never used smokeless tobacco. He reports that he does not currently use alcohol. He reports that he does not use drugs.  No Known Allergies  Family History  Problem Relation Age of Onset   Alcoholism Father    Cancer Mother     Prior to Admission medications   Medication Sig Start Date End Date Taking? Authorizing Provider  diazepam  (VALIUM ) 5 MG tablet Take 1 tablet (5 mg total) by mouth once as needed for up to 1 dose (take 30 minutes prior to vasectomy). Patient not taking: Reported on 02/20/2024 08/18/19   Lawerence Pressman, MD    Physical Exam: Vitals:   02/20/24 1700 02/20/24 1900 02/20/24 2045 02/20/24 2111  BP:   (!) 152/113 (!) 164/112  Pulse: (!) 121 (!) 116 (!) 117 (!) 125  Resp: (!) 36 (!) 37 (!) 24 20  Temp:   99.1 F (37.3 C) 98.9 F (37.2 C)  TempSrc:   Oral   SpO2: 95% 92% 93% (!) 88%  Weight:      Height:       Physical Exam  Labs on Admission: I have personally reviewed following labs and imaging studies  CBC: Recent Labs  Lab 02/20/24 1644  WBC 21.5*  HGB 15.7  HCT 46.4  MCV 83.6  PLT 260   Basic Metabolic Panel: Recent Labs  Lab 02/20/24 1644  NA 131*  K 3.7  CL 95*  CO2 23  GLUCOSE 218*  BUN 12  CREATININE 1.17  CALCIUM 8.9  MG 1.8   GFR: Estimated Creatinine Clearance: 89.3 mL/min (by C-G formula based on SCr of 1.17 mg/dL). Liver Function Tests: Recent Labs  Lab 02/20/24 1644  AST 30  ALT 36  ALKPHOS 67  BILITOT 1.4*  PROT 8.7*  ALBUMIN 4.2   Recent Labs  Lab 02/20/24 1644  LIPASE 31   No results for input(s): "AMMONIA" in the last 168 hours. Coagulation Profile: No results for input(s): "INR", "PROTIME" in the last 168 hours. Cardiac Enzymes: No results for input(s): "CKTOTAL", "CKMB", "CKMBINDEX", "TROPONINI" in the last 168 hours. BNP (last 3 results) No results for input(s): "PROBNP" in the last 8760 hours. HbA1C: No results for input(s):  "HGBA1C" in the last 72 hours. CBG: No results for input(s): "GLUCAP" in the last 168 hours. Lipid Profile: No results for input(s): "CHOL", "HDL", "LDLCALC", "TRIG", "CHOLHDL", "LDLDIRECT" in the last 72 hours. Thyroid Function Tests: Recent Labs    02/20/24 1644  TSH 0.975  FREET4 1.03   Anemia Panel: No results for input(s): "VITAMINB12", "FOLATE", "FERRITIN", "TIBC", "IRON", "RETICCTPCT" in the last 72 hours. Urine analysis: No results found for: "COLORURINE", "APPEARANCEUR", "LABSPEC", "PHURINE", "GLUCOSEU", "HGBUR", "BILIRUBINUR", "KETONESUR", "PROTEINUR", "UROBILINOGEN", "NITRITE", "LEUKOCYTESUR"  Radiological Exams on Admission: US  ABDOMEN LIMITED RUQ (LIVER/GB) Result Date: 02/20/2024 CLINICAL DATA:  Abdominal pain. EXAM: ULTRASOUND ABDOMEN LIMITED RIGHT UPPER QUADRANT COMPARISON:  Abdominal ultrasound 04/07/2007. CT abdomen and pelvis 01/14/2008. FINDINGS: Gallbladder: No gallstones or wall thickening visualized. No sonographic Murphy sign noted by sonographer. Common bile duct: Diameter: 2.3 mm Liver: No focal lesion identified. Increase in parenchymal echogenicity. Portal vein is patent on color Doppler imaging with normal direction of blood flow towards the liver. Other: There is a small amount of free fluid in the right upper quadrant. IMPRESSION: 1. No cholelithiasis or sonographic evidence for acute cholecystitis. 2. Small amount of free fluid in the right upper quadrant. 3. Increased hepatic parenchymal echogenicity suggestive of steatosis. Electronically Signed   By: Tyron Gallon M.D.   On: 02/20/2024 20:54   CT Angio Chest PE W and/or Wo Contrast Result Date: 02/20/2024 CLINICAL DATA:  Pulmonary embolus suspected with high probability. Right-sided chest pain radiating to the back starting about 1 hour ago. Shortness of breath. EXAM: CT ANGIOGRAPHY CHEST WITH CONTRAST TECHNIQUE: Multidetector CT imaging of the chest was performed using the standard protocol during bolus  administration of intravenous contrast. Multiplanar CT image reconstructions and MIPs were obtained to evaluate the vascular anatomy. RADIATION DOSE REDUCTION: This exam was performed according to the departmental dose-optimization program which includes automated exposure control, adjustment of the mA and/or kV according to patient size and/or use of iterative reconstruction technique. CONTRAST:  OMNIPAQUE IOHEXOL 350 MG/ML SOLN COMPARISON:  Chest radiograph 02/20/2024 FINDINGS: Cardiovascular: Technically adequate study with good opacification of the central and segmental pulmonary arteries. Mild motion artifact. No focal filling defects. No evidence of significant pulmonary embolus. Normal heart size. No pericardial effusions. Normal caliber thoracic aorta. No dissection. Great vessel origins are patent. Mediastinum/Nodes: Small esophageal hiatal hernia. Esophagus is decompressed. Mediastinal lymph nodes are not pathologically enlarged, likely reactive. Thyroid gland is unremarkable. Lungs/Pleura: Atelectasis or infiltration in both lung bases, possibly compressive atelectasis or pneumonia. 5 mm nodule in the right lower lung adjacent to the fissure probably representing inter fissural lymph node. Small right pleural effusion. No pneumothorax. Upper Abdomen: Small amount of free fluid around the liver edge, likely ascites. No acute abnormality is otherwise identified. Musculoskeletal: No chest wall abnormality. No  acute or significant osseous findings. Review of the MIP images confirms the above findings. IMPRESSION: 1. No evidence of significant pulmonary embolus. 2. Infiltration or atelectasis in the lung bases. Small right pleural effusion. 3. Small esophageal hiatal hernia. 4. Minimal ascites around the liver edge. Electronically Signed   By: Boyce Byes M.D.   On: 02/20/2024 18:47   DG Chest Port 1 View Result Date: 02/20/2024 CLINICAL DATA:  Chest pain. Right-sided chest pain radiating to the  back starting about our ago. Shortness of breath and sweating. EXAM: PORTABLE CHEST 1 VIEW COMPARISON:  02/13/2024 FINDINGS: Mild cardiac enlargement. Shallow inspiration. Linear atelectasis or infiltration in the lung bases, new since prior study. No pleural effusion or pneumothorax. Mediastinal contours appear intact. IMPRESSION: Shallow inspiration with atelectasis or infiltration in the lung bases, new since prior study. Mild cardiac enlargement. Electronically Signed   By: Boyce Byes M.D.   On: 02/20/2024 17:04   Data Reviewed for HPI: Relevant notes from primary care and specialist visits, past discharge summaries as available in EHR, including Care Everywhere. Prior diagnostic testing as pertinent to current admission diagnoses Updated medications and problem lists for reconciliation ED course, including vitals, labs, imaging, treatment and response to treatment Triage notes, nursing and pharmacy notes and ED provider's notes Notable results as noted above in HPI      Assessment and Plan: * CAP (community acquired pneumonia) Sepsis Acute respiratory failure with hypoxia Sepsis criteria include tachycardia and tachypnea with leukocytosis and lactic acidosis Patient desatted to 88% following admission and was placed on O2 at 2 L Rocephin and azithromycin Continue sepsis fluids.  Received bolus in the ED Follow cultures  Chest pain Right-sided chest pain, likely pleuritic pain related to developing pneumonia Troponin negative x 2 and EKG nonacute Pain control Continue treatment of pneumonia as above  Elevated bilirubin Likely secondary to sepsis Right upper quadrant ultrasound showing minimal ascites around the liver edge but otherwise without abnormality Will repeat CMP in the a.m.  Hypertensive urgency No prior diagnosis of hypertension but BP 181/113 in the ED Will treat with hydralazine IV as needed hypertensives if uncontrolled following pain  control  Hyperglycemia Blood glucose elevated at 218 with no history of diabetes Will get A1c to screen for diabetes Consistent carbohydrate diet in the interim     DVT prophylaxis: Lovenox  Consults: none  Advance Care Planning:   Code Status: Full Code   Family Communication: none  Disposition Plan: Back to previous home environment  Severity of Illness: The appropriate patient status for this patient is OBSERVATION. Observation status is judged to be reasonable and necessary in order to provide the required intensity of service to ensure the patient's safety. The patient's presenting symptoms, physical exam findings, and initial radiographic and laboratory data in the context of their medical condition is felt to place them at decreased risk for further clinical deterioration. Furthermore, it is anticipated that the patient will be medically stable for discharge from the hospital within 2 midnights of admission.   Author: Lanetta Pion, MD 02/20/2024 9:42 PM  For on call review [email or web address].

## 2024-02-20 NOTE — Hospital Course (Addendum)
 Andrew Khan

## 2024-02-20 NOTE — Assessment & Plan Note (Signed)
 Blood glucose elevated at 218 with no history of diabetes Will get A1c to screen for diabetes Consistent carbohydrate diet in the interim

## 2024-02-20 NOTE — Assessment & Plan Note (Signed)
 Likely secondary to sepsis Right upper quadrant ultrasound showing minimal ascites around the liver edge but otherwise without abnormality Will repeat CMP in the a.m. Ordered right upper quadrant ultrasound which showed possible steatosis

## 2024-02-21 DIAGNOSIS — A419 Sepsis, unspecified organism: Secondary | ICD-10-CM | POA: Diagnosis present

## 2024-02-21 DIAGNOSIS — J90 Pleural effusion, not elsewhere classified: Secondary | ICD-10-CM | POA: Diagnosis not present

## 2024-02-21 DIAGNOSIS — J9811 Atelectasis: Secondary | ICD-10-CM | POA: Diagnosis present

## 2024-02-21 DIAGNOSIS — Z1152 Encounter for screening for COVID-19: Secondary | ICD-10-CM | POA: Diagnosis not present

## 2024-02-21 DIAGNOSIS — K650 Generalized (acute) peritonitis: Secondary | ICD-10-CM | POA: Diagnosis not present

## 2024-02-21 DIAGNOSIS — R0789 Other chest pain: Secondary | ICD-10-CM | POA: Diagnosis present

## 2024-02-21 DIAGNOSIS — T3695XA Adverse effect of unspecified systemic antibiotic, initial encounter: Secondary | ICD-10-CM | POA: Diagnosis not present

## 2024-02-21 DIAGNOSIS — E66811 Obesity, class 1: Secondary | ICD-10-CM | POA: Diagnosis present

## 2024-02-21 DIAGNOSIS — R197 Diarrhea, unspecified: Secondary | ICD-10-CM | POA: Diagnosis not present

## 2024-02-21 DIAGNOSIS — E872 Acidosis, unspecified: Secondary | ICD-10-CM | POA: Diagnosis present

## 2024-02-21 DIAGNOSIS — K750 Abscess of liver: Secondary | ICD-10-CM | POA: Diagnosis present

## 2024-02-21 DIAGNOSIS — I10 Essential (primary) hypertension: Secondary | ICD-10-CM | POA: Diagnosis present

## 2024-02-21 DIAGNOSIS — J869 Pyothorax without fistula: Secondary | ICD-10-CM | POA: Diagnosis present

## 2024-02-21 DIAGNOSIS — R188 Other ascites: Secondary | ICD-10-CM | POA: Diagnosis not present

## 2024-02-21 DIAGNOSIS — K521 Toxic gastroenteritis and colitis: Secondary | ICD-10-CM | POA: Diagnosis not present

## 2024-02-21 DIAGNOSIS — J189 Pneumonia, unspecified organism: Secondary | ICD-10-CM | POA: Diagnosis present

## 2024-02-21 DIAGNOSIS — R6520 Severe sepsis without septic shock: Secondary | ICD-10-CM | POA: Diagnosis present

## 2024-02-21 DIAGNOSIS — J9601 Acute respiratory failure with hypoxia: Secondary | ICD-10-CM | POA: Diagnosis present

## 2024-02-21 DIAGNOSIS — I160 Hypertensive urgency: Secondary | ICD-10-CM | POA: Diagnosis present

## 2024-02-21 DIAGNOSIS — E876 Hypokalemia: Secondary | ICD-10-CM | POA: Diagnosis present

## 2024-02-21 DIAGNOSIS — K760 Fatty (change of) liver, not elsewhere classified: Secondary | ICD-10-CM | POA: Diagnosis present

## 2024-02-21 DIAGNOSIS — M898X8 Other specified disorders of bone, other site: Secondary | ICD-10-CM | POA: Diagnosis present

## 2024-02-21 DIAGNOSIS — J918 Pleural effusion in other conditions classified elsewhere: Secondary | ICD-10-CM | POA: Diagnosis present

## 2024-02-21 DIAGNOSIS — E871 Hypo-osmolality and hyponatremia: Secondary | ICD-10-CM | POA: Diagnosis present

## 2024-02-21 DIAGNOSIS — Y92230 Patient room in hospital as the place of occurrence of the external cause: Secondary | ICD-10-CM | POA: Diagnosis not present

## 2024-02-21 DIAGNOSIS — Z6830 Body mass index (BMI) 30.0-30.9, adult: Secondary | ICD-10-CM | POA: Diagnosis not present

## 2024-02-21 DIAGNOSIS — R739 Hyperglycemia, unspecified: Secondary | ICD-10-CM | POA: Diagnosis present

## 2024-02-21 DIAGNOSIS — R7303 Prediabetes: Secondary | ICD-10-CM | POA: Diagnosis present

## 2024-02-21 LAB — COMPREHENSIVE METABOLIC PANEL WITH GFR
ALT: 28 U/L (ref 0–44)
AST: 24 U/L (ref 15–41)
Albumin: 3.1 g/dL — ABNORMAL LOW (ref 3.5–5.0)
Alkaline Phosphatase: 53 U/L (ref 38–126)
Anion gap: 9 (ref 5–15)
BUN: 14 mg/dL (ref 6–20)
CO2: 23 mmol/L (ref 22–32)
Calcium: 7.8 mg/dL — ABNORMAL LOW (ref 8.9–10.3)
Chloride: 101 mmol/L (ref 98–111)
Creatinine, Ser: 0.94 mg/dL (ref 0.61–1.24)
GFR, Estimated: >60 mL/min (ref >60)
Glucose, Bld: 160 mg/dL — ABNORMAL HIGH (ref 70–99)
Potassium: 4 mmol/L (ref 3.5–5.1)
Sodium: 133 mmol/L — ABNORMAL LOW (ref 135–145)
Total Bilirubin: 1.5 mg/dL — ABNORMAL HIGH (ref 0.0–1.2)
Total Protein: 6.8 g/dL (ref 6.5–8.1)

## 2024-02-21 LAB — CBC
HCT: 40.7 % (ref 39.0–52.0)
Hemoglobin: 13.9 g/dL (ref 13.0–17.0)
MCH: 28.7 pg (ref 26.0–34.0)
MCHC: 34.2 g/dL (ref 30.0–36.0)
MCV: 84.1 fL (ref 80.0–100.0)
Platelets: 181 10*3/uL (ref 150–400)
RBC: 4.84 MIL/uL (ref 4.22–5.81)
RDW: 12 % (ref 11.5–15.5)
WBC: 14.6 10*3/uL — ABNORMAL HIGH (ref 4.0–10.5)
nRBC: 0 % (ref 0.0–0.2)

## 2024-02-21 LAB — HEMOGLOBIN A1C
Hgb A1c MFr Bld: 5.7 % — ABNORMAL HIGH (ref 4.8–5.6)
Mean Plasma Glucose: 116.89 mg/dL

## 2024-02-21 LAB — C DIFFICILE QUICK SCREEN W PCR REFLEX
C Diff antigen: NEGATIVE
C Diff interpretation: NOT DETECTED
C Diff toxin: NEGATIVE

## 2024-02-21 MED ORDER — ENSURE PLUS HIGH PROTEIN PO LIQD
237.0000 mL | Freq: Two times a day (BID) | ORAL | Status: DC
  Administered 2024-02-21 – 2024-02-28 (×12): 237 mL via ORAL

## 2024-02-21 NOTE — Discharge Instructions (Addendum)
 Some PCP options in [place] area- not a comprehensive list  [facility]- [phone] [facility]- [phone] [facility]- [phone] [facility]- [phone] [facility]- [phone] [facility]- [phone]  or [facility] Physician Referral Line [phone]     Interventional Radiology Percutaneous Abscess Drain Placement After Care  This sheet gives you information about how to care for yourself after your procedure. Your health care provider may also give you more specific instructions. Your drain was placed by an interventional radiologist with [facility]. If you have questions or concerns, contact [facility] at [phone]. What is a percutaneous drain? A drain is a small plastic tube (catheter) that goes into the fluid collection in your body through your skin. How long will I need the drain? How long the drain needs to stay in is determined by where the drain is, how much comes out of the drain each day and if you are having any other surgical procedures. Interventional radiology will determine when it is time to remove the drain. It is important to follow up as directed so that the drain can be removed as soon as it is safe to do so. What can I expect after the procedure? After the procedure, it is common to have: A small amount of bruising and discomfort in the area where the drainage tube (catheter) was placed. Sleepiness and fatigue. This should go away after the medicines you were given have worn off. Follow these instructions at home: Insertion site care Check your insertion site when you change the bandage. Check for: More redness, swelling, or pain. More fluid or blood. Warmth. Pus or a bad smell. When caring for your insertion site: Wash your hands with soap and water for at least 20 seconds before and after you change your bandage (dressing). If soap and water are not available, use hand sanitizer. You do not  need to change your dressing everyday if it is clean and dry. Change your dressing every 3 days or as needed when it is soiled, wet or becoming dislodged. You will need to change your dressing each time you shower. Leave stitches (sutures), skin glue, or adhesive strips in place. These skin closures may need to stay in place for 2 weeks or longer. If adhesive strip edges start to loosen and curl up, you may trim the loose edges. Do not remove adhesive strips completely unless your health care provider tells you to do so.  Catheter care Flush the catheter once per day with 5 mL of 0.9% normal saline unless you are told otherwise by your healthcare provider. This helps to prevent clogs in the catheter. To disconnect the drain, turn the clear plastic tube to the left. Attach the saline syringe by placing it on the white end of the drain and turning gently to the right. Once attached gently push the plunger to the 5 mL mark. After you are done flushing, disconnect the syringe by turning to the left and reattach your drainage container    If you have a bulb please be sure the bulb is charged after reconnecting it - to do this pinch the bulb between your thumb and first finger and close the stopper located on the top of the bulb.   Check for fluid leaking from around your catheter (instead of fluid draining through your catheter). This may be a sign that the drain is no longer working correctly. Write down the following information every time you empty your bag: The date and time. The amount of  drainage. Activity Rest at home for 1-2 days after your procedure. For the first 48 hours do not lift anything more than 10 lbs (about a gallon of milk). You may perform moderate activities/exercise. Please avoid strenuous activities during this time. Avoid any activities which may pull on your drain as this can cause your drain to become dislodged. If you were given a sedative during the procedure, it can affect  you for several hours. Do not drive or operate machinery until your health care provider says that it is safe. General instructions For mild pain take over-the-counter medications as needed for pain such as Tylenol  or Advil. If you are experiencing severe pain please call our office as this may indicate an issue with your drain.  If you were prescribed an antibiotic medicine, take it as told by your health care provider. Do not stop using the antibiotic even if you start to feel better. You may shower 24 hours after the drain is placed. To do this cover the insertion site with a water tight material such as saran wrap and seal the edges with tape, you may also purchase waterproof dressings at your local drug store. Shower as usual and then remove the water tight dressing and any gauze/tape underneath it once you have exited the shower and dried off. Allow the area to air dry or pat dry with a clean towel. Once the skin is completely dry place a new gauze dressing. It is important to keep the site dry at all times to prevent infection. Do not submerge the drain - this means you cannot take baths, swim, use a hot tub, etc. until the drain is removed.  Do not use any products that contain nicotine or tobacco, such as cigarettes, e-cigarettes, and chewing tobacco. If you need help quitting, ask your health care provider. Keep all follow-up visits as told by your health care provider. This is important. Contact a health care provider if: You have less than 10 mL of drainage a day for 2-3 days in a row, or as directed by your health care provider. You have any of these signs of infection: More redness, swelling, or pain around your incision area. More fluid or blood coming from your incision area. Warmth coming from your incision area. Pus or a bad smell coming from your incision area. You have fluid leaking from around your catheter (instead of through your catheter). You are unable to flush the  drain. You have a fever or chills. You have pain that does not get better with medicine. You have not been contacted to schedule a drain follow up appointment within 10 days of discharge from the hospital. Please call [facility] at [phone] with any questions or concerns. Get help right away if: Your catheter comes out. You suddenly stop having drainage from your catheter. You suddenly have blood in the fluid that is draining from your catheter. You become dizzy or you faint. You develop a rash. You have nausea or vomiting. You have difficulty breathing or you feel short of breath. You develop chest pain. You have problems with your speech or vision. You have trouble balancing or moving your arms or legs. Summary It is common to have a small amount of bruising and discomfort in the area where the drainage tube (catheter) was placed. You may also have minor discomfort with movement while the drain is in place. Flush the drain once per day with 5 mL of 0.9% normal saline (unless you were told  otherwise by your healthcare provider).  Record the amount of drainage from the bag every time you empty it. Change the dressing every 3 days or earlier if soiled/wet. Keep the skin dry under the dressing. You may shower with the drain in place. Do not submerge the drain (no baths, swimming, hot tubs, etc.). Contact [facility] at [phone] if you have more redness, swelling, or pain around your incision area or if you have pain that does not get better with medicine. This information is not intended to replace advice given to you by your health care provider. Make sure you discuss any questions you have with your health care provider. Document Revised: 12/07/2021 Document Reviewed: 08/29/2019 Else[employer] Patient Education  2023 [employer]    Interventional Radiology Drain Record Empty your drain at least once per day. You may empty it as often as needed. Use this form  to write down the amount of fluid that has collected in the drainage container. Bring this form with you to your follow-up visits. Please call [facility] at [phone] with any questions or concerns prior to your appointment.  Drain #1 location: ___________________ Date __________ Time __________ Amount __________ Date __________ Time __________ Amount __________ Date __________ Time __________ Amount __________ Date __________ Time __________ Amount __________ Date __________ Time __________ Amount __________ Date __________ Time __________ Amount __________ Date __________ Time __________ Amount __________ Date __________ Time __________ Amount __________ Date __________ Time __________ Amount __________ Date __________ Time __________ Amount __________ Date __________ Time __________ Amount __________ Date __________ Time __________ Amount __________ Date __________ Time __________ Amount __________ Date __________ Time __________ Amount __________     Elenora Griffiths #2 location: ___________________ Date __________ Time __________ Amount __________ Date __________ Time __________ Amount __________ Date __________ Time __________ Amount __________ Date __________ Time __________ Amount __________ Date __________ Time __________ Amount __________ Date __________ Time __________ Amount __________ Date __________ Time __________ Amount __________ Date __________ Time __________ Amount __________ Date __________ Time __________ Amount __________ Date __________ Time __________ Amount __________ Date __________ Time __________ Amount __________ Date __________ Time __________ Amount __________ Date __________ Time __________ Amount __________ Date __________ Time __________ Amount __________

## 2024-02-21 NOTE — Progress Notes (Signed)
   02/20/24 2345  Vitals  Temp (!) 103.1 F (39.5 C)  Temp Source Oral  BP (!) 164/108  MAP (mmHg) 123  BP Location Right Arm  BP Method Automatic  Patient Position (if appropriate) Lying  Pulse Rate (!) 132  Pulse Rate Source Monitor  Resp (!) 36  MEWS COLOR  MEWS Score Color Red  Oxygen Therapy  SpO2 (!) 88 %  O2 Device Room Air  MEWS Score  MEWS Temp 2  MEWS Systolic 0  MEWS Pulse 3  MEWS RR 3  MEWS LOC 0  MEWS Score 8  Provider Notification  Provider Name/Title Brion Cancel, MD  Date Provider Notified 02/21/24  Time Provider Notified 0008  Method of Notification Page  Notification Reason Change in status (red MEWS score)  Provider response No new orders  Date of Provider Response 02/21/24  Time of Provider Response 0012   Blood cultures and lactic acid x2 were drawn in ED. This RN gave tylenol for the fever, provided washcloth for diaphoresis, toradol for pain 7/10, and placed pt on 3L O2 for increased RR and low SpO2. Pt placed on large continuous bedside monitor and VS retaken every hour for 4 hours. Pt is already being treated for sepsis, so no new orders. Brion Cancel, MD and Baron Lichtenstein, ICU Charge RN, notified.   Pt remained afebrile after tylenol, pain meds effective, tachycardia trended down to low 100s, BP slightly trended down but remained elevated, and respirations trended from 30s-40s down to 20s-30s. SpO2 remained WNL after 3L O2 applied. MEWS score yellow/green. Pt reports feeling better and able to breathe easier. Deep breathing and coughing recommended.

## 2024-02-21 NOTE — TOC CM/SW Note (Signed)
 Transition of Care [facility]) - Inpatient Brief Assessment   Patient Details  Name: Andrew Khan MRN: [id number] Date of Birth: 1976-04-15  [facility] CM/SW Contact:    Odilia Bennett, LCSW Phone Number: 02/21/2024, 2:36 PM   Clinical Narrative: CSW reviewed chart. Patient does not have a PCP. Added list to AVS. No other TOC needs identified so far. CSW will continue to follow progress. Please place [facility] consult if any needs arise.  Transition of Care Asessment: Insurance and Status: Insurance coverage has been reviewed Patient has primary care physician: No Home environment has been reviewed: Single family home Prior level of function:: Not documented Prior/Current Home Services: No current home services Social Drivers of Health Review: SDOH reviewed no interventions necessary Readmission risk has been reviewed: Yes Transition of care needs: no transition of care needs at this time

## 2024-02-21 NOTE — Progress Notes (Signed)
 Progress Note    Andrew Khan  ZOX:[id number] DOB: [birth year]  DOA: 02/20/2024 PCP: Patient, No Pcp Per      Brief Narrative:    Medical records reviewed and are as summarized below:  Andrew Khan is a 48 y.o. male with no significant past medical history presented to the hospital with right-sided lower chest pain, 2 weeks duration and acute onset of shortness of breath that started on the day of admission.   He said he had injury to the right side of his rib and saw a chiropractor for manipulation about a week prior to admission.  In the ED, he was diaphoretic, tachypneic and tachycardic.  He subsequently became febrile and hypoxic with oxygen saturation of 88% on room air.       Assessment/Plan:   Principal Problem:   Severe sepsis (HCC) Active Problems:   CAP (community acquired pneumonia)   Chest pain   Elevated bilirubin   Hypertensive urgency   Hyperglycemia    Body mass index is 30.09 kg/m.    Severe sepsis secondary to community-acquired pneumonia: Continue empiric IV ceftriaxone and azithromycin.  Follow-up blood cultures. Lactic acid was 2.4, 2.6. WBC down from 21.5-14.6.   Right-sided pleuritic chest pain: Likely due to pneumonia No evidence of pulmonary embolism on CT chest.   Acute hypoxic respiratory failure: He is on 3 L/min oxygen via [place].  Wean off oxygen as able.   Hypertensive urgency: BP is better.  No prior diagnosis of hypertension.   Hyperglycemia: Glucose levels are better.  Ordered hemoglobin A1c for further evaluation.    Hyponatremia: Sodium up from 131-133.   Mild hyperbilirubinemia: Probably from sepsis.  Liver ultrasound showed steatosis.   Plan discussed with his wife at the bedside  Diet Order             Diet regular Room service appropriate? Yes; Fluid consistency: Thin  Diet effective now                            Consultants: None  Procedures: None    Medications:     enoxaparin (LOVENOX) injection  40 mg Subcutaneous Q24H   guaiFENesin  600 mg Oral BID   lidocaine  1 patch Transdermal Q24H   Continuous Infusions:  azithromycin     cefTRIAXone (ROCEPHIN)  IV     lactated ringers 150 mL/hr (02/21/24 0624)     Anti-infectives (From admission, onward)    Start     Dose/Rate Route Frequency Ordered Stop   02/21/24 1800  cefTRIAXone (ROCEPHIN) 2 g in sodium chloride 0.9 % 100 mL IVPB        2 g 200 mL/hr over 30 Minutes Intravenous Every 24 hours 02/20/24 1957 02/26/24 1759   02/21/24 1800  azithromycin (ZITHROMAX) 500 mg in sodium chloride 0.9 % 250 mL IVPB        500 mg 250 mL/hr over 60 Minutes Intravenous Every 24 hours 02/20/24 1957 02/26/24 1759   02/20/24 1815  cefTRIAXone (ROCEPHIN) 2 g in sodium chloride 0.9 % 100 mL IVPB        2 g 200 mL/hr over 30 Minutes Intravenous Once 02/20/24 1807 02/20/24 1934   02/20/24 1815  azithromycin (ZITHROMAX) 500 mg in sodium chloride 0.9 % 250 mL IVPB        500 mg 250 mL/hr over 60 Minutes Intravenous  Once 02/20/24 1807 02/20/24 2054  Family Communication/Anticipated D/C date and plan/Code Status   DVT prophylaxis: enoxaparin (LOVENOX) injection 40 mg Start: 02/20/24 2200     Code Status: Full Code  Family Communication: Plan discussed with his wife at the bedside Disposition Plan: Plan to discharge home   Status is: Observation The patient will require care spanning > 2 midnights and should be moved to inpatient because: Sepsis from pneumonia       Subjective:   Interval events noted. C/o right-sided chest pain. He still feels short of breath but it's better. He had fever with temp of 103.1 F.  Objective:    Vitals:   02/21/24 0300 02/21/24 0400 02/21/24 0640 02/21/24 0753  BP: (!) 132/91 (!) 139/100  (!) 148/92  Pulse: (!) 106 (!) 109    Resp: 20 (!) 28  18  Temp: 98.3 F (36.8 C) 100 F (37.8 C) 99 F (37.2 C) 98.4 F (36.9 C)  TempSrc: Oral Oral Oral    SpO2: 96% 95%  95%  Weight:      Height:       No data found.   Intake/Output Summary (Last 24 hours) at 02/21/2024 1101 Last data filed at 02/21/2024 1021 Gross per 24 hour  Intake 4405.34 ml  Output --  Net 4405.34 ml   Filed Weights   02/20/24 1641 02/20/24 2100  Weight: 88.5 kg 95.1 kg    Exam:  GEN: NAD SKIN: No rash EYES: No pallor or icterus ENT: MMM CV: RRR, tachycardic PULM: Bilateral rhonchi at the lung bases ABD: soft, ND, mild right flank tenderness, +BS CNS: AAO x 3, non focal EXT: No edema or tenderness        Data Reviewed:   I have personally reviewed following labs and imaging studies:  Labs: Labs show the following:   Basic Metabolic Panel: Recent Labs  Lab 02/20/24 1644 02/21/24 0519  NA 131* 133*  K 3.7 4.0  CL 95* 101  CO2 23 23  GLUCOSE 218* 160*  BUN 12 14  CREATININE 1.17 0.94  CALCIUM 8.9 7.8*  MG 1.8  --    GFR Estimated Creatinine Clearance: 114.8 mL/min (by C-G formula based on SCr of 0.94 mg/dL). Liver Function Tests: Recent Labs  Lab 02/20/24 1644 02/21/24 0519  AST 30 24  ALT 36 28  ALKPHOS 67 53  BILITOT 1.4* 1.5*  PROT 8.7* 6.8  ALBUMIN 4.2 3.1*   Recent Labs  Lab 02/20/24 1644  LIPASE 31   No results for input(s): "AMMONIA" in the last 168 hours. Coagulation profile No results for input(s): "INR", "PROTIME" in the last 168 hours.  CBC: Recent Labs  Lab 02/20/24 1644 02/21/24 0519  WBC 21.5* 14.6*  HGB 15.7 13.9  HCT 46.4 40.7  MCV 83.6 84.1  PLT 260 181   Cardiac Enzymes: No results for input(s): "CKTOTAL", "CKMB", "CKMBINDEX", "TROPONINI" in the last 168 hours. BNP (last 3 results) No results for input(s): "PROBNP" in the last 8760 hours. CBG: No results for input(s): "GLUCAP" in the last 168 hours. D-Dimer: Recent Labs    02/20/24 1644  DDIMER 3.11*   Hgb A1c: No results for input(s): "HGBA1C" in the last 72 hours. Lipid Profile: No results for input(s): "CHOL", "HDL",  "LDLCALC", "TRIG", "CHOLHDL", "LDLDIRECT" in the last 72 hours. Thyroid function studies: Recent Labs    02/20/24 1644  TSH 0.975   Anemia work up: No results for input(s): "VITAMINB12", "FOLATE", "FERRITIN", "TIBC", "IRON", "RETICCTPCT" in the last 72 hours. Sepsis Labs: Recent Labs  Lab 02/20/24 1644 02/20/24 1857 02/20/24 2027 02/21/24 0519  WBC 21.5*  --   --  14.6*  LATICACIDVEN  --  2.4* 2.6*  --     Microbiology Recent Results (from the past 240 hours)  Resp panel by RT-PCR (RSV, Flu A&B, Covid) Anterior Nasal Swab     Status: None   Collection Time: 02/20/24  6:57 PM   Specimen: Anterior Nasal Swab  Result Value Ref Range Status   SARS Coronavirus 2 by RT PCR NEGATIVE NEGATIVE Final    Comment: (NOTE) SARS-CoV-2 target nucleic acids are NOT DETECTED.  The SARS-CoV-2 RNA is generally detectable in upper respiratory specimens during the acute phase of infection. The lowest concentration of SARS-CoV-2 viral copies this assay can detect is 138 copies/mL. A negative result does not preclude SARS-Cov-2 infection and should not be used as the sole basis for treatment or other patient management decisions. A negative result may occur with  improper specimen collection/handling, submission of specimen other than nasopharyngeal swab, presence of viral mutation(s) within the areas targeted by this assay, and inadequate number of viral copies(<138 copies/mL). A negative result must be combined with clinical observations, patient history, and epidemiological information. The expected result is Negative.  Fact Sheet for Patients:  BloggerCourse.com  Fact Sheet for Healthcare Providers:  SeriousBroker.it  This test is no t yet approved or cleared by the [place]  FDA and  has been authorized for detection and/or diagnosis of SARS-CoV-2 by FDA under an Emergency Use Authorization (EUA). This EUA will remain  in effect  (meaning this test can be used) for the duration of the COVID-19 declaration under Section 564(b)(1) of the Act, 21 U.S.C.section 360bbb-3(b)(1), unless the authorization is terminated  or revoked sooner.       Influenza A by PCR NEGATIVE NEGATIVE Final   Influenza B by PCR NEGATIVE NEGATIVE Final    Comment: (NOTE) The Xpert Xpress SARS-CoV-2/FLU/RSV plus assay is intended as an aid in the diagnosis of influenza from Nasopharyngeal swab specimens and should not be used as a sole basis for treatment. Nasal washings and aspirates are unacceptable for Xpert Xpress SARS-CoV-2/FLU/RSV testing.  Fact Sheet for Patients: BloggerCourse.com  Fact Sheet for Healthcare Providers: SeriousBroker.it  This test is not yet approved or cleared by the [place]  FDA and has been authorized for detection and/or diagnosis of SARS-CoV-2 by FDA under an Emergency Use Authorization (EUA). This EUA will remain in effect (meaning this test can be used) for the duration of the COVID-19 declaration under Section 564(b)(1) of the Act, 21 U.S.C. section 360bbb-3(b)(1), unless the authorization is terminated or revoked.     Resp Syncytial Virus by PCR NEGATIVE NEGATIVE Final    Comment: (NOTE) Fact Sheet for Patients: BloggerCourse.com  Fact Sheet for Healthcare Providers: SeriousBroker.it  This test is not yet approved or cleared by the [place]  FDA and has been authorized for detection and/or diagnosis of SARS-CoV-2 by FDA under an Emergency Use Authorization (EUA). This EUA will remain in effect (meaning this test can be used) for the duration of the COVID-19 declaration under Section 564(b)(1) of the Act, 21 U.S.C. section 360bbb-3(b)(1), unless the authorization is terminated or revoked.  Performed at [facility], [address] [place], [place] [address]   Blood culture  (routine x 2)     Status: None (Preliminary result)   Collection Time: 02/20/24  6:57 PM   Specimen: BLOOD  Result Value Ref Range Status   Specimen Description BLOOD RIGHT FA  Final   Special Requests   Final    BOTTLES DRAWN AEROBIC AND ANAEROBIC Blood Culture results may not be optimal due to an inadequate volume of blood received in culture bottles   Culture   Final    NO GROWTH < 12 HOURS Performed at [facility], [address] [place], [place] [address]    Report Status PENDING  Incomplete  Blood culture (routine x 2)     Status: None (Preliminary result)   Collection Time: 02/20/24  6:57 PM   Specimen: BLOOD  Result Value Ref Range Status   Specimen Description BLOOD RIGTH [facility]  Final   Special Requests   Final    BOTTLES DRAWN AEROBIC AND ANAEROBIC Blood Culture results may not be optimal due to an inadequate volume of blood received in culture bottles   Culture   Final    NO GROWTH < 12 HOURS Performed at [facility], [address] [place], [place] [address]    Report Status PENDING  Incomplete    Procedures and diagnostic studies:  US  ABDOMEN LIMITED RUQ (LIVER/GB) Result Date: 02/20/2024 CLINICAL DATA:  Abdominal pain. EXAM: ULTRASOUND ABDOMEN LIMITED RIGHT UPPER QUADRANT COMPARISON:  Abdominal ultrasound 04/07/2007. CT abdomen and pelvis 01/14/2008. FINDINGS: Gallbladder: No gallstones or wall thickening visualized. No sonographic Murphy sign noted by sonographer. Common bile duct: Diameter: 2.3 mm Liver: No focal lesion identified. Increase in parenchymal echogenicity. Portal vein is patent on color Doppler imaging with normal direction of blood flow towards the liver. Other: There is a small amount of free fluid in the right upper quadrant. IMPRESSION: 1. No cholelithiasis or sonographic evidence for acute cholecystitis. 2. Small amount of free fluid in the right upper quadrant. 3. Increased hepatic parenchymal echogenicity suggestive of steatosis.  Electronically Signed   By: Tyron Gallon M.D.   On: 02/20/2024 20:54   CT Angio Chest PE W and/or Wo Contrast Result Date: 02/20/2024 CLINICAL DATA:  Pulmonary embolus suspected with high probability. Right-sided chest pain radiating to the back starting about 1 hour ago. Shortness of breath. EXAM: CT ANGIOGRAPHY CHEST WITH CONTRAST TECHNIQUE: Multidetector CT imaging of the chest was performed using the standard protocol during bolus administration of intravenous contrast. Multiplanar CT image reconstructions and MIPs were obtained to evaluate the vascular anatomy. RADIATION DOSE REDUCTION: This exam was performed according to the departmental dose-optimization program which includes automated exposure control, adjustment of the mA and/or kV according to patient size and/or use of iterative reconstruction technique. CONTRAST:  OMNIPAQUE IOHEXOL 350 MG/ML SOLN COMPARISON:  Chest radiograph 02/20/2024 FINDINGS: Cardiovascular: Technically adequate study with good opacification of the central and segmental pulmonary arteries. Mild motion artifact. No focal filling defects. No evidence of significant pulmonary embolus. Normal heart size. No pericardial effusions. Normal caliber thoracic aorta. No dissection. Great vessel origins are patent. Mediastinum/Nodes: Small esophageal hiatal hernia. Esophagus is decompressed. Mediastinal lymph nodes are not pathologically enlarged, likely reactive. Thyroid gland is unremarkable. Lungs/Pleura: Atelectasis or infiltration in both lung bases, possibly compressive atelectasis or pneumonia. 5 mm nodule in the right lower lung adjacent to the fissure probably representing inter fissural lymph node. Small right pleural effusion. No pneumothorax. Upper Abdomen: Small amount of free fluid around the liver edge, likely ascites. No acute abnormality is otherwise identified. Musculoskeletal: No chest wall abnormality. No acute or significant osseous findings. Review of the MIP  images confirms the above findings. IMPRESSION: 1. No evidence of significant pulmonary embolus. 2. Infiltration or atelectasis in the lung bases.  Small right pleural effusion. 3. Small esophageal hiatal hernia. 4. Minimal ascites around the liver edge. Electronically Signed   By: Boyce Byes M.D.   On: 02/20/2024 18:47   DG Chest Port 1 View Result Date: 02/20/2024 CLINICAL DATA:  Chest pain. Right-sided chest pain radiating to the back starting about our ago. Shortness of breath and sweating. EXAM: PORTABLE CHEST 1 VIEW COMPARISON:  02/13/2024 FINDINGS: Mild cardiac enlargement. Shallow inspiration. Linear atelectasis or infiltration in the lung bases, new since prior study. No pleural effusion or pneumothorax. Mediastinal contours appear intact. IMPRESSION: Shallow inspiration with atelectasis or infiltration in the lung bases, new since prior study. Mild cardiac enlargement. Electronically Signed   By: Boyce Byes M.D.   On: 02/20/2024 17:04               LOS: 0 days   Garyn Waguespack  [facility]   Pager on [email or web address]. If 7PM-7AM, please contact night-coverage at [email or web address]     02/21/2024, 11:01 AM

## 2024-02-22 DIAGNOSIS — R6520 Severe sepsis without septic shock: Secondary | ICD-10-CM | POA: Diagnosis not present

## 2024-02-22 DIAGNOSIS — A419 Sepsis, unspecified organism: Secondary | ICD-10-CM | POA: Diagnosis not present

## 2024-02-22 LAB — CBC WITH DIFFERENTIAL/PLATELET
Abs Immature Granulocytes: 0.16 10*3/uL — ABNORMAL HIGH (ref 0.00–0.07)
Basophils Absolute: 0 10*3/uL (ref 0.0–0.1)
Basophils Relative: 0 %
Eosinophils Absolute: 0 10*3/uL (ref 0.0–0.5)
Eosinophils Relative: 0 %
HCT: 37.2 % — ABNORMAL LOW (ref 39.0–52.0)
Hemoglobin: 12.8 g/dL — ABNORMAL LOW (ref 13.0–17.0)
Immature Granulocytes: 1 %
Lymphocytes Relative: 4 %
Lymphs Abs: 0.6 10*3/uL — ABNORMAL LOW (ref 0.7–4.0)
MCH: 28.5 pg (ref 26.0–34.0)
MCHC: 34.4 g/dL (ref 30.0–36.0)
MCV: 82.9 fL (ref 80.0–100.0)
Monocytes Absolute: 0.9 10*3/uL (ref 0.1–1.0)
Monocytes Relative: 6 %
Neutro Abs: 12.9 10*3/uL — ABNORMAL HIGH (ref 1.7–7.7)
Neutrophils Relative %: 89 %
Platelets: 170 10*3/uL (ref 150–400)
RBC: 4.49 MIL/uL (ref 4.22–5.81)
RDW: 12.1 % (ref 11.5–15.5)
WBC: 14.5 10*3/uL — ABNORMAL HIGH (ref 4.0–10.5)
nRBC: 0 % (ref 0.0–0.2)

## 2024-02-22 LAB — BASIC METABOLIC PANEL WITH GFR
Anion gap: 11 (ref 5–15)
BUN: 17 mg/dL (ref 6–20)
CO2: 22 mmol/L (ref 22–32)
Calcium: 8.1 mg/dL — ABNORMAL LOW (ref 8.9–10.3)
Chloride: 95 mmol/L — ABNORMAL LOW (ref 98–111)
Creatinine, Ser: 0.89 mg/dL (ref 0.61–1.24)
GFR, Estimated: >60 mL/min (ref >60)
Glucose, Bld: 153 mg/dL — ABNORMAL HIGH (ref 70–99)
Potassium: 3.5 mmol/L (ref 3.5–5.1)
Sodium: 128 mmol/L — ABNORMAL LOW (ref 135–145)

## 2024-02-22 LAB — STREP PNEUMONIAE URINARY ANTIGEN: Strep Pneumo Urinary Antigen: NEGATIVE

## 2024-02-22 LAB — GLUCOSE, CAPILLARY: Glucose-Capillary: 143 mg/dL — ABNORMAL HIGH (ref 70–99)

## 2024-02-22 LAB — MRSA NEXT GEN BY PCR, NASAL: MRSA by PCR Next Gen: NOT DETECTED

## 2024-02-22 MED ORDER — PIPERACILLIN-TAZOBACTAM 3.375 G IVPB
3.3750 g | Freq: Three times a day (TID) | INTRAVENOUS | Status: DC
  Administered 2024-02-22 – 2024-03-03 (×29): 3.375 g via INTRAVENOUS
  Filled 2024-02-22 (×29): qty 50

## 2024-02-22 MED ORDER — SODIUM CHLORIDE 0.9 % IV SOLN: INTRAVENOUS | Status: AC

## 2024-02-22 MED ORDER — LOPERAMIDE HCL 2 MG PO CAPS
2.0000 mg | ORAL_CAPSULE | ORAL | Status: DC | PRN
  Administered 2024-02-22: 2 mg via ORAL
  Filled 2024-02-22: qty 1

## 2024-02-22 NOTE — Progress Notes (Signed)
   02/22/24 1952  Assess: MEWS Score  Temp 98.7 F (37.1 C)  BP (!) 148/95  MAP (mmHg) 112  Pulse Rate (!) 114  Resp 18  Level of Consciousness Alert  SpO2 96 %  O2 Device Nasal Cannula  O2 Flow Rate (L/min) 2 L/min  Assess: MEWS Score  MEWS Temp 0  MEWS Systolic 0  MEWS Pulse 2  MEWS RR 0  MEWS LOC 0  MEWS Score 2  MEWS Score Color Yellow  Assess: if the MEWS score is Yellow or Red  Were vital signs accurate and taken at a resting state? Yes  Does the patient meet 2 or more of the SIRS criteria? Yes  Does the patient have a confirmed or suspected source of infection? Yes  MEWS guidelines implemented  No, previously yellow, continue vital signs every 4 hours  Notify: Charge Nurse/RN  Name of Charge Nurse/RN Notified Courtney  Assess: SIRS CRITERIA  SIRS Temperature  0  SIRS Respirations  0  SIRS Pulse 1  SIRS WBC 0  SIRS Score Sum  1

## 2024-02-22 NOTE — Progress Notes (Signed)
   02/22/24 0229  Vitals  Temp (!) 102.3 F (39.1 C)  BP (!) 158/91  Pulse Rate (!) 125  Resp 20  Level of Consciousness  Level of Consciousness Alert  MEWS COLOR  MEWS Score Color Red  Oxygen Therapy  SpO2 92 %  O2 Device Nasal Cannula  O2 Flow Rate (L/min) 3 L/min  MEWS Score  MEWS Temp 2  MEWS Systolic 0  MEWS Pulse 2  MEWS RR 0  MEWS LOC 0  MEWS Score 4  Provider Notification  Provider Name/Title Dr. Vallarie Gauze  Date Provider Notified 02/22/24  Time Provider Notified [phone]  Method of Notification Page  Notification Reason  (Red MEWS)

## 2024-02-22 NOTE — Plan of Care (Signed)
  Problem: Respiratory: Goal: Ability to maintain adequate ventilation will improve Outcome: Progressing   Problem: Education: Goal: Knowledge of General Education information will improve Description: Including pain rating scale, medication(s)/side effects and non-pharmacologic comfort measures Outcome: Progressing   Problem: Clinical Measurements: Goal: Respiratory complications will improve Outcome: Progressing   Problem: Activity: Goal: Risk for activity intolerance will decrease Outcome: Progressing   Problem: Nutrition: Goal: Adequate nutrition will be maintained Outcome: Progressing

## 2024-02-22 NOTE — Plan of Care (Signed)
  Problem: Fluid Volume: Goal: Hemodynamic stability will improve Outcome: Progressing   Problem: Respiratory: Goal: Ability to maintain adequate ventilation will improve Outcome: Progressing   Problem: Clinical Measurements: Goal: Ability to maintain clinical measurements within normal limits will improve Outcome: Progressing   Problem: Nutrition: Goal: Adequate nutrition will be maintained Outcome: Progressing

## 2024-02-22 NOTE — Progress Notes (Signed)
 Progress Note    Andrew Khan  ZOX:[id number] DOB: Feb 07, 1976  DOA: 02/20/2024 PCP: Patient, No Pcp Per      Brief Narrative:    Medical records reviewed and are as summarized below:  Andrew Khan is a 48 y.o. male with no significant past medical history presented to the hospital with right-sided lower chest pain, 2 weeks duration and acute onset of shortness of breath that started on the day of admission.   He said he had injury to the right side of his rib and saw a chiropractor for manipulation about a week prior to admission.  In the ED, he was diaphoretic, tachypneic and tachycardic.  He subsequently became febrile and hypoxic with oxygen saturation of 88% on room air.       Assessment/Plan:   Principal Problem:   Severe sepsis (HCC) Active Problems:   CAP (community acquired pneumonia)   Chest pain   Elevated bilirubin   Hypertensive urgency   Hyperglycemia   Sepsis (HCC)    Body mass index is 30.09 kg/m.    Severe sepsis secondary to community-acquired pneumonia: Continue IV ceftriaxone  and azithromycin .  No growth on blood cultures thus far.  Check strep pneumo and Legionella urine antigens.  Lactic acid was 2.4, 2.6. WBC down from 21.5-14.6-14.5 .   Right-sided pleuritic chest pain: Likely due to pneumonia No evidence of pulmonary embolism on CT chest.   Acute hypoxic respiratory failure: He was on 3 L/min oxygen this morning.  Wean off oxygen as able.   Hypertensive urgency: BP is better but he remains hypertensive.  He said his blood pressure has been normal for the past 4 years or so. No antihypertensives for now.  Continue to monitor.   Hyperglycemia, prediabetes: Glucose levels are better.  Hemoglobin A1c 5.7 suggestive of prediabetes   Hyponatremia: Sodium down from 133-128.  Probably worse from recent diarrhea.  Restart IV fluids (normal saline) for hydration.   Acute diarrheal illness: Likely from antibiotics.  Stool for  C. difficile toxin was negative.  Imodium as needed for diarrhea.   Mild hyperbilirubinemia: Probably from sepsis.  Liver ultrasound showed steatosis.     Diet Order             Diet regular Room service appropriate? Yes; Fluid consistency: Thin  Diet effective now                            Consultants: None  Procedures: None    Medications:    enoxaparin  (LOVENOX ) injection  40 mg Subcutaneous Q24H   feeding supplement  237 mL Oral BID BM   guaiFENesin   600 mg Oral BID   lidocaine   1 patch Transdermal Q24H   Continuous Infusions:  sodium chloride  75 mL/hr at 02/22/24 0829   azithromycin  500 mg (02/21/24 1818)   cefTRIAXone  (ROCEPHIN )  IV 2 g (02/21/24 1716)     Anti-infectives (From admission, onward)    Start     Dose/Rate Route Frequency Ordered Stop   02/21/24 1800  cefTRIAXone  (ROCEPHIN ) 2 g in sodium chloride  0.9 % 100 mL IVPB        2 g 200 mL/hr over 30 Minutes Intravenous Every 24 hours 02/20/24 1957 02/26/24 1759   02/21/24 1800  azithromycin  (ZITHROMAX ) 500 mg in sodium chloride  0.9 % 250 mL IVPB        500 mg 250 mL/hr over 60 Minutes Intravenous Every 24 hours 02/20/24 1957  02/26/24 1759   02/20/24 1815  cefTRIAXone  (ROCEPHIN ) 2 g in sodium chloride  0.9 % 100 mL IVPB        2 g 200 mL/hr over 30 Minutes Intravenous Once 02/20/24 1807 02/20/24 1934   02/20/24 1815  azithromycin  (ZITHROMAX ) 500 mg in sodium chloride  0.9 % 250 mL IVPB        500 mg 250 mL/hr over 60 Minutes Intravenous  Once 02/20/24 1807 02/20/24 2054              Family Communication/Anticipated D/C date and plan/Code Status   DVT prophylaxis: enoxaparin  (LOVENOX ) injection 40 mg Start: 02/20/24 2200     Code Status: Full Code  Family Communication: Plan discussed with his wife at the bedside Disposition Plan: Plan to discharge home   Status is: Inpatient Remains inpatient appropriate because: Sepsis from pneumonia         Subjective:    Interval events noted.  He had multiple loose stools yesterday but no diarrhea today.  Breathing is better.  He still has pain in the right lower chest.  Objective:    Vitals:   02/22/24 0432 02/22/24 0519 02/22/24 0844 02/22/24 1221  BP: (!) 146/92 (!) 152/87 (!) 158/97 (!) 152/94  Pulse: (!) 109 (!) 105 (!) 122 (!) 104  Resp: 20 20 18 16   Temp: 98.3 F (36.8 C) 99 F (37.2 C) 98.8 F (37.1 C) 98.5 F (36.9 C)  TempSrc:      SpO2: 95% 95% 98% 93%  Weight:      Height:       No data found.  No intake or output data in the 24 hours ending 02/22/24 1341  Filed Weights   02/20/24 1641 02/20/24 2100  Weight: 88.5 kg 95.1 kg    Exam:  GEN: NAD SKIN: Warm and dry EYES: No pallor or icterus ENT: MMM CV: RRR, tachycardic PULM: Bilateral rhonchi at the lung bases ABD: soft, ND, NT, +BS CNS: AAO x 3, non focal EXT: No edema or tenderness      Data Reviewed:   I have personally reviewed following labs and imaging studies:  Labs: Labs show the following:   Basic Metabolic Panel: Recent Labs  Lab 02/20/24 1644 02/21/24 0519 02/22/24 0429  NA 131* 133* 128*  K 3.7 4.0 3.5  CL 95* 101 95*  CO2 23 23 22   GLUCOSE 218* 160* 153*  BUN 12 14 17   CREATININE 1.17 0.94 0.89  CALCIUM 8.9 7.8* 8.1*  MG 1.8  --   --    GFR Estimated Creatinine Clearance: 121.3 mL/min (by C-G formula based on SCr of 0.89 mg/dL). Liver Function Tests: Recent Labs  Lab 02/20/24 1644 02/21/24 0519  AST 30 24  ALT 36 28  ALKPHOS 67 53  BILITOT 1.4* 1.5*  PROT 8.7* 6.8  ALBUMIN 4.2 3.1*   Recent Labs  Lab 02/20/24 1644  LIPASE 31   No results for input(s): "AMMONIA" in the last 168 hours. Coagulation profile No results for input(s): "INR", "PROTIME" in the last 168 hours.  CBC: Recent Labs  Lab 02/20/24 1644 02/21/24 0519 02/22/24 0429  WBC 21.5* 14.6* 14.5*  NEUTROABS  --   --  12.9*  HGB 15.7 13.9 12.8*  HCT 46.4 40.7 37.2*  MCV 83.6 84.1 82.9  PLT 260 181  170   Cardiac Enzymes: No results for input(s): "CKTOTAL", "CKMB", "CKMBINDEX", "TROPONINI" in the last 168 hours. BNP (last 3 results) No results for input(s): "PROBNP" in the last 8760  hours. CBG: No results for input(s): "GLUCAP" in the last 168 hours. D-Dimer: Recent Labs    02/20/24 1644  DDIMER 3.11*   Hgb A1c: Recent Labs    02/21/24 0519  HGBA1C 5.7*   Lipid Profile: No results for input(s): "CHOL", "HDL", "LDLCALC", "TRIG", "CHOLHDL", "LDLDIRECT" in the last 72 hours. Thyroid function studies: Recent Labs    02/20/24 1644  TSH 0.975   Anemia work up: No results for input(s): "VITAMINB12", "FOLATE", "FERRITIN", "TIBC", "IRON", "RETICCTPCT" in the last 72 hours. Sepsis Labs: Recent Labs  Lab 02/20/24 1644 02/20/24 1857 02/20/24 2027 02/21/24 0519 02/22/24 0429  WBC 21.5*  --   --  14.6* 14.5*  LATICACIDVEN  --  2.4* 2.6*  --   --     Microbiology Recent Results (from the past 240 hours)  Resp panel by RT-PCR (RSV, Flu A&B, Covid) Anterior Nasal Swab     Status: None   Collection Time: 02/20/24  6:57 PM   Specimen: Anterior Nasal Swab  Result Value Ref Range Status   SARS Coronavirus 2 by RT PCR NEGATIVE NEGATIVE Final    Comment: (NOTE) SARS-CoV-2 target nucleic acids are NOT DETECTED.  The SARS-CoV-2 RNA is generally detectable in upper respiratory specimens during the acute phase of infection. The lowest concentration of SARS-CoV-2 viral copies this assay can detect is 138 copies/mL. A negative result does not preclude SARS-Cov-2 infection and should not be used as the sole basis for treatment or other patient management decisions. A negative result may occur with  improper specimen collection/handling, submission of specimen other than nasopharyngeal swab, presence of viral mutation(s) within the areas targeted by this assay, and inadequate number of viral copies(<138 copies/mL). A negative result must be combined with clinical observations,  patient history, and epidemiological information. The expected result is Negative.  Fact Sheet for Patients:  BloggerCourse.com  Fact Sheet for Healthcare Providers:  SeriousBroker.it  This test is no t yet approved or cleared by the [place]  FDA and  has been authorized for detection and/or diagnosis of SARS-CoV-2 by FDA under an Emergency Use Authorization (EUA). This EUA will remain  in effect (meaning this test can be used) for the duration of the COVID-19 declaration under Section 564(b)(1) of the Act, 21 U.S.C.section 360bbb-3(b)(1), unless the authorization is terminated  or revoked sooner.       Influenza A by PCR NEGATIVE NEGATIVE Final   Influenza B by PCR NEGATIVE NEGATIVE Final    Comment: (NOTE) The Xpert Xpress SARS-CoV-2/FLU/RSV plus assay is intended as an aid in the diagnosis of influenza from Nasopharyngeal swab specimens and should not be used as a sole basis for treatment. Nasal washings and aspirates are unacceptable for Xpert Xpress SARS-CoV-2/FLU/RSV testing.  Fact Sheet for Patients: BloggerCourse.com  Fact Sheet for Healthcare Providers: SeriousBroker.it  This test is not yet approved or cleared by the [place]  FDA and has been authorized for detection and/or diagnosis of SARS-CoV-2 by FDA under an Emergency Use Authorization (EUA). This EUA will remain in effect (meaning this test can be used) for the duration of the COVID-19 declaration under Section 564(b)(1) of the Act, 21 U.S.C. section 360bbb-3(b)(1), unless the authorization is terminated or revoked.     Resp Syncytial Virus by PCR NEGATIVE NEGATIVE Final    Comment: (NOTE) Fact Sheet for Patients: BloggerCourse.com  Fact Sheet for Healthcare Providers: SeriousBroker.it  This test is not yet approved or cleared by the [place] FDA and has been authorized for detection and/or diagnosis  of SARS-CoV-2 by FDA under an Emergency Use Authorization (EUA). This EUA will remain in effect (meaning this test can be used) for the duration of the COVID-19 declaration under Section 564(b)(1) of the Act, 21 U.S.C. section 360bbb-3(b)(1), unless the authorization is terminated or revoked.  Performed at [facility], [address] [place], [place] [address]   Blood culture (routine x 2)     Status: None (Preliminary result)   Collection Time: 02/20/24  6:57 PM   Specimen: BLOOD  Result Value Ref Range Status   Specimen Description BLOOD RIGHT FA  Final   Special Requests   Final    BOTTLES DRAWN AEROBIC AND ANAEROBIC Blood Culture results may not be optimal due to an inadequate volume of blood received in culture bottles   Culture   Final    NO GROWTH 2 DAYS Performed at [facility], [address], [place], [place] [address]    Report Status PENDING  Incomplete  Blood culture (routine x 2)     Status: None (Preliminary result)   Collection Time: 02/20/24  6:57 PM   Specimen: BLOOD  Result Value Ref Range Status   Specimen Description BLOOD RIGTH [facility]  Final   Special Requests   Final    BOTTLES DRAWN AEROBIC AND ANAEROBIC Blood Culture results may not be optimal due to an inadequate volume of blood received in culture bottles   Culture   Final    NO GROWTH 2 DAYS Performed at [facility], [address], [place], [place] [address]    Report Status PENDING  Incomplete  C Difficile Quick Screen w PCR reflex     Status: None   Collection Time: 02/21/24  6:30 PM   Specimen: STOOL  Result Value Ref Range Status   C Diff antigen NEGATIVE NEGATIVE Final   C Diff toxin NEGATIVE NEGATIVE Final   C Diff interpretation No C. difficile detected.  Final    Comment: Performed at [facility], [address] [place], [place] [address]    Procedures and diagnostic  studies:  US  ABDOMEN LIMITED RUQ (LIVER/GB) Result Date: 02/20/2024 CLINICAL DATA:  Abdominal pain. EXAM: ULTRASOUND ABDOMEN LIMITED RIGHT UPPER QUADRANT COMPARISON:  Abdominal ultrasound 04/07/2007. CT abdomen and pelvis 01/14/2008. FINDINGS: Gallbladder: No gallstones or wall thickening visualized. No sonographic Murphy sign noted by sonographer. Common bile duct: Diameter: 2.3 mm Liver: No focal lesion identified. Increase in parenchymal echogenicity. Portal vein is patent on color Doppler imaging with normal direction of blood flow towards the liver. Other: There is a small amount of free fluid in the right upper quadrant. IMPRESSION: 1. No cholelithiasis or sonographic evidence for acute cholecystitis. 2. Small amount of free fluid in the right upper quadrant. 3. Increased hepatic parenchymal echogenicity suggestive of steatosis. Electronically Signed   By: Tyron Gallon M.D.   On: 02/20/2024 20:54   CT Angio Chest PE W and/or Wo Contrast Result Date: 02/20/2024 CLINICAL DATA:  Pulmonary embolus suspected with high probability. Right-sided chest pain radiating to the back starting about 1 hour ago. Shortness of breath. EXAM: CT ANGIOGRAPHY CHEST WITH CONTRAST TECHNIQUE: Multidetector CT imaging of the chest was performed using the standard protocol during bolus administration of intravenous contrast. Multiplanar CT image reconstructions and MIPs were obtained to evaluate the vascular anatomy. RADIATION DOSE REDUCTION: This exam was performed according to the departmental dose-optimization program which includes automated exposure control, adjustment of the mA and/or kV according to patient size and/or use of iterative reconstruction  technique. CONTRAST:  OMNIPAQUE  IOHEXOL  350 MG/ML SOLN COMPARISON:  Chest radiograph 02/20/2024 FINDINGS: Cardiovascular: Technically adequate study with good opacification of the central and segmental pulmonary arteries. Mild motion artifact. No focal filling defects. No  evidence of significant pulmonary embolus. Normal heart size. No pericardial effusions. Normal caliber thoracic aorta. No dissection. Great vessel origins are patent. Mediastinum/Nodes: Small esophageal hiatal hernia. Esophagus is decompressed. Mediastinal lymph nodes are not pathologically enlarged, likely reactive. Thyroid gland is unremarkable. Lungs/Pleura: Atelectasis or infiltration in both lung bases, possibly compressive atelectasis or pneumonia. 5 mm nodule in the right lower lung adjacent to the fissure probably representing inter fissural lymph node. Small right pleural effusion. No pneumothorax. Upper Abdomen: Small amount of free fluid around the liver edge, likely ascites. No acute abnormality is otherwise identified. Musculoskeletal: No chest wall abnormality. No acute or significant osseous findings. Review of the MIP images confirms the above findings. IMPRESSION: 1. No evidence of significant pulmonary embolus. 2. Infiltration or atelectasis in the lung bases. Small right pleural effusion. 3. Small esophageal hiatal hernia. 4. Minimal ascites around the liver edge. Electronically Signed   By: Boyce Byes M.D.   On: 02/20/2024 18:47   DG Chest Port 1 View Result Date: 02/20/2024 CLINICAL DATA:  Chest pain. Right-sided chest pain radiating to the back starting about our ago. Shortness of breath and sweating. EXAM: PORTABLE CHEST 1 VIEW COMPARISON:  02/13/2024 FINDINGS: Mild cardiac enlargement. Shallow inspiration. Linear atelectasis or infiltration in the lung bases, new since prior study. No pleural effusion or pneumothorax. Mediastinal contours appear intact. IMPRESSION: Shallow inspiration with atelectasis or infiltration in the lung bases, new since prior study. Mild cardiac enlargement. Electronically Signed   By: Boyce Byes M.D.   On: 02/20/2024 17:04               LOS: 1 day   Marlyn Rabine  [facility]   Pager on [email or web address]. If 7PM-7AM, please contact  night-coverage at [email or web address]     02/22/2024, 1:41 PM

## 2024-02-23 ENCOUNTER — Inpatient Hospital Stay

## 2024-02-23 DIAGNOSIS — R6520 Severe sepsis without septic shock: Secondary | ICD-10-CM | POA: Diagnosis not present

## 2024-02-23 DIAGNOSIS — A419 Sepsis, unspecified organism: Secondary | ICD-10-CM | POA: Diagnosis not present

## 2024-02-23 LAB — CBC WITH DIFFERENTIAL/PLATELET
Abs Immature Granulocytes: 0.54 10*3/uL — ABNORMAL HIGH (ref 0.00–0.07)
Basophils Absolute: 0.1 10*3/uL (ref 0.0–0.1)
Basophils Relative: 0 %
Eosinophils Absolute: 0.2 10*3/uL (ref 0.0–0.5)
Eosinophils Relative: 1 %
HCT: 37.5 % — ABNORMAL LOW (ref 39.0–52.0)
Hemoglobin: 12.3 g/dL — ABNORMAL LOW (ref 13.0–17.0)
Immature Granulocytes: 3 %
Lymphocytes Relative: 5 %
Lymphs Abs: 0.8 10*3/uL (ref 0.7–4.0)
MCH: 27.7 pg (ref 26.0–34.0)
MCHC: 32.8 g/dL (ref 30.0–36.0)
MCV: 84.5 fL (ref 80.0–100.0)
Monocytes Absolute: 1.5 10*3/uL — ABNORMAL HIGH (ref 0.1–1.0)
Monocytes Relative: 9 %
Neutro Abs: 13.5 10*3/uL — ABNORMAL HIGH (ref 1.7–7.7)
Neutrophils Relative %: 82 %
Platelets: 205 10*3/uL (ref 150–400)
RBC: 4.44 MIL/uL (ref 4.22–5.81)
RDW: 12.3 % (ref 11.5–15.5)
WBC: 16.6 10*3/uL — ABNORMAL HIGH (ref 4.0–10.5)
nRBC: 0 % (ref 0.0–0.2)

## 2024-02-23 LAB — BASIC METABOLIC PANEL WITH GFR
Anion gap: 7 (ref 5–15)
BUN: 14 mg/dL (ref 6–20)
CO2: 26 mmol/L (ref 22–32)
Calcium: 8.3 mg/dL — ABNORMAL LOW (ref 8.9–10.3)
Chloride: 98 mmol/L (ref 98–111)
Creatinine, Ser: 0.93 mg/dL (ref 0.61–1.24)
GFR, Estimated: >60 mL/min (ref >60)
Glucose, Bld: 132 mg/dL — ABNORMAL HIGH (ref 70–99)
Potassium: 3.9 mmol/L (ref 3.5–5.1)
Sodium: 131 mmol/L — ABNORMAL LOW (ref 135–145)

## 2024-02-23 MED ORDER — LOSARTAN POTASSIUM 25 MG PO TABS
25.0000 mg | ORAL_TABLET | Freq: Every day | ORAL | Status: DC
  Administered 2024-02-23 – 2024-02-24 (×2): 25 mg via ORAL
  Filled 2024-02-23 (×2): qty 1

## 2024-02-23 NOTE — Progress Notes (Signed)
 Patient transferred from 2A to room 228. Hypertensive yet VSS. IV antibiotic soon due. Will continue to monitor and assess with plan of care.

## 2024-02-23 NOTE — Progress Notes (Signed)
   02/23/24 0000  Assess: MEWS Score  Temp 99.4 F (37.4 C)  BP (!) 166/91  MAP (mmHg) 109  Pulse Rate (!) 117  Resp 20  Level of Consciousness Alert  SpO2 98 %  O2 Device Nasal Cannula  O2 Flow Rate (L/min) 2 L/min  Assess: MEWS Score  MEWS Temp 0  MEWS Systolic 0  MEWS Pulse 2  MEWS RR 0  MEWS LOC 0  MEWS Score 2  MEWS Score Color Yellow  Assess: if the MEWS score is Yellow or Red  Were vital signs accurate and taken at a resting state? Yes  MEWS guidelines implemented  No, previously yellow, continue vital signs every 4 hours  Notify: Charge Nurse/RN  Name of Charge Nurse/RN Notified Courtney  Assess: SIRS CRITERIA  SIRS Temperature  0  SIRS Respirations  0  SIRS Pulse 1  SIRS WBC 0  SIRS Score Sum  1

## 2024-02-23 NOTE — Progress Notes (Addendum)
 Progress Note    [name]  VWU:[id number] DOB: Jan 19, 1976  DOA: 02/20/2024 PCP: Patient, No Pcp Per      Brief Narrative:    Medical records reviewed and are as summarized below:  Andrew Khan is a 48 y.o. male with no significant past medical history presented to the hospital with right-sided lower chest pain, 2 weeks duration and acute onset of shortness of breath that started on the day of admission.   He said he had injury to the right side of his rib and saw a chiropractor for manipulation about a week prior to admission.  In the ED, he was diaphoretic, tachypneic and tachycardic.  He subsequently became febrile and hypoxic with oxygen saturation of 88% on room air.       Assessment/Plan:   Principal Problem:   Severe sepsis (HCC) Active Problems:   CAP (community acquired pneumonia)   Chest pain   Elevated bilirubin   Hypertensive urgency   Hyperglycemia   Sepsis (HCC)    Body mass index is 30.09 kg/m.    Severe sepsis secondary to community-acquired pneumonia: MRSA screen was negative.  Repeated chest x-ray today which was unremarkable.  IV ceftriaxone  was changed to IV Zosyn.  Continue azithromycin .  No growth on blood cultures thus far. Strep pneumo urine antigen was negative.  Legionella urine antigen is pending. Lactic acid was 2.4, 2.6. WBC down from 21.5-14.6-14.5-16.6 .   Right-sided pleuritic chest pain: Likely due to pneumonia No evidence of pulmonary embolism on CT chest.   Acute hypoxic respiratory failure: Improved.  He is tolerating room air.   Hypertensive urgency: BP still elevated.  Start losartan for hypertension.  Discussed management of hypertension and potential side effects of antihypertensives.  Patient agreeable to start losartan.[name]   Hyperglycemia, prediabetes: Glucose levels are better.  Hemoglobin A1c 5.7 suggestive of prediabetes   Hyponatremia: Improved from 128-131.  Discontinue IV fluids.   Acute  diarrheal illness: Likely from antibiotics.  Stool for C. difficile toxin was negative.  Improved.   Mild hyperbilirubinemia: Probably from sepsis.  Liver ultrasound showed steatosis.     Diet Order             Diet regular Room service appropriate? Yes; Fluid consistency: Thin  Diet effective now                            Consultants: None  Procedures: None    Medications:    enoxaparin  (LOVENOX ) injection  40 mg Subcutaneous Q24H   feeding supplement  237 mL Oral BID BM   guaiFENesin   600 mg Oral BID   lidocaine   1 patch Transdermal Q24H   Continuous Infusions:  azithromycin  500 mg (02/22/24 1823)   piperacillin-tazobactam (ZOSYN)  IV 3.375 g (02/23/24 1131)     Anti-infectives (From admission, onward)    Start     Dose/Rate Route Frequency Ordered Stop   02/22/24 1900  piperacillin-tazobactam (ZOSYN) IVPB 3.375 g        3.375 g 12.5 mL/hr over 240 Minutes Intravenous Every 8 hours 02/22/24 1727     02/21/24 1800  cefTRIAXone  (ROCEPHIN ) 2 g in sodium chloride  0.9 % 100 mL IVPB  Status:  Discontinued        2 g 200 mL/hr over 30 Minutes Intravenous Every 24 hours 02/20/24 1957 02/22/24 1725   02/21/24 1800  azithromycin  (ZITHROMAX ) 500 mg in sodium chloride  0.9 % 250 mL IVPB  500 mg 250 mL/hr over 60 Minutes Intravenous Every 24 hours 02/20/24 1957 02/26/24 1759   02/20/24 1815  cefTRIAXone  (ROCEPHIN ) 2 g in sodium chloride  0.9 % 100 mL IVPB        2 g 200 mL/hr over 30 Minutes Intravenous Once 02/20/24 1807 02/20/24 1934   02/20/24 1815  azithromycin  (ZITHROMAX ) 500 mg in sodium chloride  0.9 % 250 mL IVPB        500 mg 250 mL/hr over 60 Minutes Intravenous  Once 02/20/24 1807 02/20/24 2054              Family Communication/Anticipated D/C date and plan/Code Status   DVT prophylaxis: enoxaparin  (LOVENOX ) injection 40 mg Start: 02/20/24 2200     Code Status: Full Code  Family Communication: None Disposition Plan: Plan to  discharge home   Status is: Inpatient Remains inpatient appropriate because: Sepsis from pneumonia         Subjective:   Interval events noted.  He had low-grade fever with temperature of 100.6 F this morning.  He thinks he is breathing better but he is coughing more.  Objective:    Vitals:   02/23/24 0426 02/23/24 0736 02/23/24 1131 02/23/24 1247  BP: (!) 141/93 (!) 169/89  (!) 170/88  Pulse: (!) 102 (!) 117  96  Resp: 18 19  20   Temp: 98.9 F (37.2 C) (!) 100.6 F (38.1 C) 97.9 F (36.6 C)   TempSrc: Oral  Oral   SpO2: 94% 92%  96%  Weight:      Height:       No data found.   Intake/Output Summary (Last 24 hours) at 02/23/2024 1323 Last data filed at 02/23/2024 0517 Gross per 24 hour  Intake 1741.68 ml  Output --  Net 1741.68 ml    Filed Weights   02/20/24 1641 02/20/24 2100  Weight: 88.5 kg 95.1 kg    Exam:   GEN: NAD SKIN: Warm and dry EYES: No pallor or icterus ENT: MMM CV: RRR, tachycardic PULM: CTA B ABD: soft, ND, NT, +BS CNS: AAO x 3, non focal EXT: No edema or tenderness       Data Reviewed:   I have personally reviewed following labs and imaging studies:  Labs: Labs show the following:   Basic Metabolic Panel: Recent Labs  Lab 02/20/24 1644 02/21/24 0519 02/22/24 0429 02/23/24 0414  NA 131* 133* 128* 131*  K 3.7 4.0 3.5 3.9  CL 95* 101 95* 98  CO2 23 23 22 26   GLUCOSE 218* 160* 153* 132*  BUN 12 14 17 14   CREATININE 1.17 0.94 0.89 0.93  CALCIUM 8.9 7.8* 8.1* 8.3*  MG 1.8  --   --   --    GFR Estimated Creatinine Clearance: 116.1 mL/min (by C-G formula based on SCr of 0.93 mg/dL). Liver Function Tests: Recent Labs  Lab 02/20/24 1644 02/21/24 0519  AST 30 24  ALT 36 28  ALKPHOS 67 53  BILITOT 1.4* 1.5*  PROT 8.7* 6.8  ALBUMIN 4.2 3.1*   Recent Labs  Lab 02/20/24 1644  LIPASE 31   No results for input(s): "AMMONIA" in the last 168 hours. Coagulation profile No results for input(s): "INR", "PROTIME" in  the last 168 hours.  CBC: Recent Labs  Lab 02/20/24 1644 02/21/24 0519 02/22/24 0429 02/23/24 0414  WBC 21.5* 14.6* 14.5* 16.6*  NEUTROABS  --   --  12.9* 13.5*  HGB 15.7 13.9 12.8* 12.3*  HCT 46.4 40.7 37.2* 37.5*  MCV 83.6  84.1 82.9 84.5  PLT 260 181 170 205   Cardiac Enzymes: No results for input(s): "CKTOTAL", "CKMB", "CKMBINDEX", "TROPONINI" in the last 168 hours. BNP (last 3 results) No results for input(s): "PROBNP" in the last 8760 hours. CBG: Recent Labs  Lab 02/22/24 2114  GLUCAP 143*   D-Dimer: Recent Labs    02/20/24 1644  DDIMER 3.11*   Hgb A1c: Recent Labs    02/21/24 0519  HGBA1C 5.7*   Lipid Profile: No results for input(s): "CHOL", "HDL", "LDLCALC", "TRIG", "CHOLHDL", "LDLDIRECT" in the last 72 hours. Thyroid function studies: Recent Labs    02/20/24 1644  TSH 0.975   Anemia work up: No results for input(s): "VITAMINB12", "FOLATE", "FERRITIN", "TIBC", "IRON", "RETICCTPCT" in the last 72 hours. Sepsis Labs: Recent Labs  Lab 02/20/24 1644 02/20/24 1857 02/20/24 2027 02/21/24 0519 02/22/24 0429 02/23/24 0414  WBC 21.5*  --   --  14.6* 14.5* 16.6*  LATICACIDVEN  --  2.4* 2.6*  --   --   --     Microbiology Recent Results (from the past 240 hours)  Resp panel by RT-PCR (RSV, Flu A&B, Covid) Anterior Nasal Swab     Status: None   Collection Time: 02/20/24  6:57 PM   Specimen: Anterior Nasal Swab  Result Value Ref Range Status   SARS Coronavirus 2 by RT PCR NEGATIVE NEGATIVE Final    Comment: (NOTE) SARS-CoV-2 target nucleic acids are NOT DETECTED.  The SARS-CoV-2 RNA is generally detectable in upper respiratory specimens during the acute phase of infection. The lowest concentration of SARS-CoV-2 viral copies this assay can detect is 138 copies/mL. A negative result does not preclude SARS-Cov-2 infection and should not be used as the sole basis for treatment or other patient management decisions. A negative result may occur with   improper specimen collection/handling, submission of specimen other than nasopharyngeal swab, presence of viral mutation(s) within the areas targeted by this assay, and inadequate number of viral copies(<138 copies/mL). A negative result must be combined with clinical observations, patient history, and epidemiological information. The expected result is Negative.  Fact Sheet for Patients:  BloggerCourse.com  Fact Sheet for Healthcare Providers:  SeriousBroker.it  This test is no t yet approved or cleared by the [place]  FDA and  has been authorized for detection and/or diagnosis of SARS-CoV-2 by FDA under an Emergency Use Authorization (EUA). This EUA will remain  in effect (meaning this test can be used) for the duration of the COVID-19 declaration under Section 564(b)(1) of the Act, 21 U.S.C.section 360bbb-3(b)(1), unless the authorization is terminated  or revoked sooner.       Influenza A by PCR NEGATIVE NEGATIVE Final   Influenza B by PCR NEGATIVE NEGATIVE Final    Comment: (NOTE) The Xpert Xpress SARS-CoV-2/FLU/RSV plus assay is intended as an aid in the diagnosis of influenza from Nasopharyngeal swab specimens and should not be used as a sole basis for treatment. Nasal washings and aspirates are unacceptable for Xpert Xpress SARS-CoV-2/FLU/RSV testing.  Fact Sheet for Patients: BloggerCourse.com  Fact Sheet for Healthcare Providers: SeriousBroker.it  This test is not yet approved or cleared by the [place]  FDA and has been authorized for detection and/or diagnosis of SARS-CoV-2 by FDA under an Emergency Use Authorization (EUA). This EUA will remain in effect (meaning this test can be used) for the duration of the COVID-19 declaration under Section 564(b)(1) of the Act, 21 U.S.C. section 360bbb-3(b)(1), unless the authorization is terminated or revoked.      Resp  Syncytial Virus by PCR NEGATIVE NEGATIVE Final    Comment: (NOTE) Fact Sheet for Patients: BloggerCourse.com  Fact Sheet for Healthcare Providers: SeriousBroker.it  This test is not yet approved or cleared by the [place]  FDA and has been authorized for detection and/or diagnosis of SARS-CoV-2 by FDA under an Emergency Use Authorization (EUA). This EUA will remain in effect (meaning this test can be used) for the duration of the COVID-19 declaration under Section 564(b)(1) of the Act, 21 U.S.C. section 360bbb-3(b)(1), unless the authorization is terminated or revoked.  Performed at [facility], [address] [place], [place] [address]   Blood culture (routine x 2)     Status: None (Preliminary result)   Collection Time: 02/20/24  6:57 PM   Specimen: BLOOD  Result Value Ref Range Status   Specimen Description BLOOD RIGHT FA  Final   Special Requests   Final    BOTTLES DRAWN AEROBIC AND ANAEROBIC Blood Culture results may not be optimal due to an inadequate volume of blood received in culture bottles   Culture   Final    NO GROWTH 3 DAYS Performed at [facility], [address], [place], [place] [address]    Report Status PENDING  Incomplete  Blood culture (routine x 2)     Status: None (Preliminary result)   Collection Time: 02/20/24  6:57 PM   Specimen: BLOOD  Result Value Ref Range Status   Specimen Description BLOOD [facility]  Final   Special Requests   Final    BOTTLES DRAWN AEROBIC AND ANAEROBIC Blood Culture results may not be optimal due to an inadequate volume of blood received in culture bottles   Culture   Final    NO GROWTH 3 DAYS Performed at [facility], [address] [place], [place] [address]    Report Status PENDING  Incomplete  C Difficile Quick Screen w PCR reflex     Status: None   Collection Time: 02/21/24  6:30 PM   Specimen: STOOL  Result Value Ref Range  Status   C Diff antigen NEGATIVE NEGATIVE Final   C Diff toxin NEGATIVE NEGATIVE Final   C Diff interpretation No C. difficile detected.  Final    Comment: Performed at [facility], [address] [place], [place] [address]  MRSA Next Gen by PCR, Nasal     Status: None   Collection Time: 02/22/24  6:15 PM   Specimen: Nasal Mucosa; Nasal Swab  Result Value Ref Range Status   MRSA by PCR Next Gen NOT DETECTED NOT DETECTED Final    Comment: (NOTE) The GeneXpert MRSA Assay (FDA approved for NASAL specimens only), is one component of a comprehensive MRSA colonization surveillance program. It is not intended to diagnose MRSA infection nor to guide or monitor treatment for MRSA infections. Test performance is not FDA approved in patients less than 52 years old. Performed at [facility], [address] [place], [place] [address]     Procedures and diagnostic studies:  DG Chest 2 View Result Date: 02/23/2024 CLINICAL DATA:  Pneumonia EXAM: CHEST - 2 VIEW COMPARISON:  02/20/2024 FINDINGS: Large right-sided pleural effusion. Linear basilar opacities consistent with subsegmental atelectasis. Normal pulmonary vasculature. No pneumothorax. Cardiac silhouette is obscured. IMPRESSION: No active cardiopulmonary disease. Electronically Signed   By: Sydell Eva M.D.   On: 02/23/2024 09:13               LOS: 2 days   Verline Kong  [facility]   Pager on [email or web address]. If 7PM-7AM, please contact night-coverage at [email or web address]     02/23/2024, 1:23 PM

## 2024-02-24 ENCOUNTER — Inpatient Hospital Stay

## 2024-02-24 DIAGNOSIS — A419 Sepsis, unspecified organism: Secondary | ICD-10-CM | POA: Diagnosis not present

## 2024-02-24 DIAGNOSIS — R6520 Severe sepsis without septic shock: Secondary | ICD-10-CM | POA: Diagnosis not present

## 2024-02-24 LAB — CBC WITH DIFFERENTIAL/PLATELET
Abs Immature Granulocytes: 0.7 10*3/uL — ABNORMAL HIGH (ref 0.00–0.07)
Basophils Absolute: 0.1 10*3/uL (ref 0.0–0.1)
Basophils Relative: 1 %
Eosinophils Absolute: 0.3 10*3/uL (ref 0.0–0.5)
Eosinophils Relative: 2 %
HCT: 37.7 % — ABNORMAL LOW (ref 39.0–52.0)
Hemoglobin: 12.5 g/dL — ABNORMAL LOW (ref 13.0–17.0)
Immature Granulocytes: 4 %
Lymphocytes Relative: 5 %
Lymphs Abs: 0.9 10*3/uL (ref 0.7–4.0)
MCH: 27.9 pg (ref 26.0–34.0)
MCHC: 33.2 g/dL (ref 30.0–36.0)
MCV: 84.2 fL (ref 80.0–100.0)
Monocytes Absolute: 1.5 10*3/uL — ABNORMAL HIGH (ref 0.1–1.0)
Monocytes Relative: 9 %
Neutro Abs: 12.7 10*3/uL — ABNORMAL HIGH (ref 1.7–7.7)
Neutrophils Relative %: 79 %
Platelets: 251 10*3/uL (ref 150–400)
RBC: 4.48 MIL/uL (ref 4.22–5.81)
RDW: 12.7 % (ref 11.5–15.5)
WBC: 16.1 10*3/uL — ABNORMAL HIGH (ref 4.0–10.5)
nRBC: 0 % (ref 0.0–0.2)

## 2024-02-24 LAB — COMPREHENSIVE METABOLIC PANEL WITH GFR
ALT: 55 U/L — ABNORMAL HIGH (ref 0–44)
AST: 78 U/L — ABNORMAL HIGH (ref 15–41)
Albumin: 2.8 g/dL — ABNORMAL LOW (ref 3.5–5.0)
Alkaline Phosphatase: 110 U/L (ref 38–126)
Anion gap: 10 (ref 5–15)
BUN: 18 mg/dL (ref 6–20)
CO2: 26 mmol/L (ref 22–32)
Calcium: 8.3 mg/dL — ABNORMAL LOW (ref 8.9–10.3)
Chloride: 96 mmol/L — ABNORMAL LOW (ref 98–111)
Creatinine, Ser: 1.06 mg/dL (ref 0.61–1.24)
GFR, Estimated: >60 mL/min (ref >60)
Glucose, Bld: 154 mg/dL — ABNORMAL HIGH (ref 70–99)
Potassium: 3.5 mmol/L (ref 3.5–5.1)
Sodium: 132 mmol/L — ABNORMAL LOW (ref 135–145)
Total Bilirubin: 0.9 mg/dL (ref 0.0–1.2)
Total Protein: 6.6 g/dL (ref 6.5–8.1)

## 2024-02-24 LAB — LEGIONELLA PNEUMOPHILA SEROGP 1 UR AG: L. pneumophila Serogp 1 Ur Ag: NEGATIVE

## 2024-02-24 LAB — LACTATE DEHYDROGENASE: LDH: 226 U/L — ABNORMAL HIGH (ref 98–192)

## 2024-02-24 MED ORDER — LACTATED RINGERS IV SOLN: INTRAVENOUS | Status: DC

## 2024-02-24 MED ORDER — AZITHROMYCIN 250 MG PO TABS
500.0000 mg | ORAL_TABLET | Freq: Every day | ORAL | Status: AC
  Administered 2024-02-24 – 2024-02-25 (×2): 500 mg via ORAL
  Filled 2024-02-24 (×2): qty 2

## 2024-02-24 MED ORDER — IOHEXOL 300 MG/ML  SOLN
75.0000 mL | Freq: Once | INTRAMUSCULAR | Status: AC | PRN
  Administered 2024-02-24: 75 mL via INTRAVENOUS

## 2024-02-24 NOTE — Progress Notes (Signed)
 Pt and wife at bedside shared concern on speaking to MD on day shift prior to my shift; per pt,  MD was to call back and it been more than an hour.

## 2024-02-24 NOTE — Progress Notes (Signed)
 PHARMACIST - PHYSICIAN COMMUNICATION CONCERNING: Antibiotic IV to Oral Route Change Policy  RECOMMENDATION: This patient is receiving azithromycin  intravenously. Based on criteria approved by the Pharmacy and Therapeutics Committee, the antibiotic(s) is/are being converted to the equivalent dose of an oral formulation.   DESCRIPTION: These criteria include: Patient being treated for a respiratory tract infection, urinary tract infection, cellulitis or Clostridioides difficile-associated diarrhea if on metronidazole. The patient is not neutropenic and does not exhibit a malabsorptive GI state. The patient is eating (either orally or via tube) and/or has been taking other orally administered medications for at least 24 hours. The patient is improving clinically and has a 24-hour Tmax of <100.5 F.  If you have questions about this conversion, please contact the Pharmacy Department:  [x]   [phone] )  [facility] []   [phone] )  [facility] []   [phone] )  [facility]  []   [phone] )  Maryan Smalling   Will M. Alva Jewels, PharmD Clinical Pharmacist 02/24/2024 7:54 AM

## 2024-02-24 NOTE — Progress Notes (Signed)
   02/24/24 1535  Assess: MEWS Score  Temp (!) 101.9 F (38.8 C)  BP (!) 160/84  MAP (mmHg) 103  Pulse Rate (!) 124  ECG Heart Rate (!) 130  Resp 18  SpO2 90 %  O2 Device Room Air  Assess: MEWS Score  MEWS Temp 2  MEWS Systolic 0  MEWS Pulse 3  MEWS RR 0  MEWS LOC 0  MEWS Score 5  MEWS Score Color Red  Assess: if the MEWS score is Yellow or Red  Were vital signs accurate and taken at a resting state? Yes  Does the patient meet 2 or more of the SIRS criteria? Yes  Does the patient have a confirmed or suspected source of infection? Yes  MEWS guidelines implemented  Yes, red  Treat  MEWS Interventions Considered administering scheduled or prn medications/treatments as ordered  Take Vital Signs  Increase Vital Sign Frequency  Red: Q1hr x2, continue Q4hrs until patient remains green for 12hrs  Escalate  MEWS: Escalate Red: Discuss with charge nurse and notify provider. Consider notifying RRT. If remains red for 2 hours consider need for higher level of care  Notify: Charge Nurse/RN  Name of Charge Nurse/RN Notified Ale, RN  Provider Notification  Provider Name/Title Dr. Leory Rands, MD  Date Provider Notified 02/24/24  Time Provider Notified 1540  Method of Notification Page  Notification Reason Change in status (Red MEWS score)  Provider response See new orders  Date of Provider Response 02/24/24  Time of Provider Response 1540  Notify: Rapid Response  Name of Rapid Response RN Notified Rapid Reponse RN  Date Rapid Response Notified 02/24/24  Time Rapid Response Notified 1540  Assess: SIRS CRITERIA  SIRS Temperature  1  SIRS Respirations  0  SIRS Pulse 1  SIRS WBC 0  SIRS Score Sum  2

## 2024-02-24 NOTE — Plan of Care (Signed)
  Problem: Clinical Measurements: Goal: Diagnostic test results will improve Outcome: Progressing Goal: Signs and symptoms of infection will decrease Outcome: Progressing   Problem: Respiratory: Goal: Ability to maintain adequate ventilation will improve Outcome: Progressing   Problem: Education: Goal: Knowledge of General Education information will improve Description: Including pain rating scale, medication(s)/side effects and non-pharmacologic comfort measures Outcome: Progressing   Problem: Health Behavior/Discharge Planning: Goal: Ability to manage health-related needs will improve Outcome: Progressing   Problem: Activity: Goal: Risk for activity intolerance will decrease Outcome: Progressing   Problem: Nutrition: Goal: Adequate nutrition will be maintained Outcome: Progressing   Problem: Coping: Goal: Level of anxiety will decrease Outcome: Progressing   Problem: Elimination: Goal: Will not experience complications related to bowel motility Outcome: Progressing Goal: Will not experience complications related to urinary retention Outcome: Progressing   Problem: Pain Managment: Goal: General experience of comfort will improve and/or be controlled Outcome: Progressing   Problem: Safety: Goal: Ability to remain free from injury will improve Outcome: Progressing   Problem: Skin Integrity: Goal: Risk for impaired skin integrity will decrease Outcome: Progressing

## 2024-02-24 NOTE — Progress Notes (Signed)
 Infectious Disease     Reason for Consult:Pneumonia, fever    Referring Physician: Dr Leory Rands Date of Admission:  02/20/2024   Principal Problem:   Severe sepsis (HCC) Active Problems:   CAP (community acquired pneumonia)   Hyperglycemia   Elevated bilirubin   Hypertensive urgency   Chest pain   Sepsis [facility])   HPI: Andrew Khan is a 48 y.o. male admitted 6/5 with SOB, with 2 weeks prior R lower chest pain. Initial concern for PE with + D dimer, elevated wbc 21 k.  but none on CT with infiltrate at bases, small R effusion.  HIV neg, A1c nml, Neg COvid flu RSV, neg strep and legionella urine ag. MRSA PCR neg, C diff neg. BCX neg  Recent increase LFTs. US  6/5 with small amt of free fluid RUQ, hepatic steatosis. CXR with large R effusions  No recent wt loss, NS. No aspiration events. Does have chickens at home and dog but no oter animal exposures. No sick contacts. No travel   History reviewed. No pertinent past medical history. History reviewed. No pertinent surgical history. Social History   Tobacco Use   Smoking status: Never   Smokeless tobacco: Never  Vaping Use   Vaping status: Never Used  Substance Use Topics   Alcohol use: Not Currently   Drug use: Never   Family History  Problem Relation Age of Onset   Alcoholism Father    Cancer Mother     Allergies: No Known Allergies  Current antibiotics: Antibiotics Given (last 72 hours)     Date/Time Action Medication Dose Rate   02/21/24 1716 New Bag/Given   cefTRIAXone  (ROCEPHIN ) 2 g in sodium chloride  0.9 % 100 mL IVPB 2 g 200 mL/hr   02/21/24 1818 New Bag/Given   azithromycin  (ZITHROMAX ) 500 mg in sodium chloride  0.9 % 250 mL IVPB 500 mg 250 mL/hr   02/22/24 1654 New Bag/Given   cefTRIAXone  (ROCEPHIN ) 2 g in sodium chloride  0.9 % 100 mL IVPB 2 g 200 mL/hr   02/22/24 1823 New Bag/Given   azithromycin  (ZITHROMAX ) 500 mg in sodium chloride  0.9 % 250 mL IVPB 500 mg 250 mL/hr   02/22/24 2000 New Bag/Given    piperacillin-tazobactam (ZOSYN) IVPB 3.375 g 3.375 g 12.5 mL/hr   02/23/24 0313 New Bag/Given   piperacillin-tazobactam (ZOSYN) IVPB 3.375 g 3.375 g 12.5 mL/hr   02/23/24 1131 New Bag/Given   piperacillin-tazobactam (ZOSYN) IVPB 3.375 g 3.375 g 12.5 mL/hr   02/23/24 1728 New Bag/Given   azithromycin  (ZITHROMAX ) 500 mg in sodium chloride  0.9 % 250 mL IVPB 500 mg 250 mL/hr   02/23/24 1942 New Bag/Given   piperacillin-tazobactam (ZOSYN) IVPB 3.375 g 3.375 g 12.5 mL/hr   02/24/24 0314 New Bag/Given   piperacillin-tazobactam (ZOSYN) IVPB 3.375 g 3.375 g 12.5 mL/hr   02/24/24 1050 New Bag/Given   piperacillin-tazobactam (ZOSYN) IVPB 3.375 g 3.375 g 12.5 mL/hr       MEDICATIONS:  azithromycin   500 mg Oral Daily   enoxaparin  (LOVENOX ) injection  40 mg Subcutaneous Q24H   feeding supplement  237 mL Oral BID BM   guaiFENesin   600 mg Oral BID   lidocaine   1 patch Transdermal Q24H   losartan  25 mg Oral Daily    Review of Systems - 11 systems reviewed and negative per HPI   OBJECTIVE: Temp:  [98.3 F (36.8 C)-101.9 F (38.8 C)] 101.9 F (38.8 C) (06/09 1535) Pulse Rate:  [99-124] 124 (06/09 1535) Resp:  [18-20] 18 (06/09 1535) BP: (  144-167)/(84-95) 160/84 (06/09 1535) SpO2:  [88 %-98 %] 90 % (06/09 1535) Physical Exam  Constitutional: He is oriented to person, place, and time. He appears well-developed and well-nourished. No distress.  HENT:  Mouth/Throat: Oropharynx is clear and moist. No oropharyngeal exudate.  Cardiovascular: Normal rate, regular rhythm and normal heart sounds. Exam reveals no gallop and no friction rub.  No murmur heard.  Pulmonary/Chest: Dec BS R base Abdominal: Soft. Bowel sounds are normal. He exhibits no distension. There is no tenderness.  Lymphadenopathy:  He has no cervical adenopathy.  Neurological: He is alert and oriented to person, place, and time.  Skin: Skin is warm and dry. No rash noted. No erythema.  Psychiatric: He has a normal mood and  affect. His behavior is normal.     LABS: Results for orders placed or performed during the hospital encounter of 02/20/24 (from the past 48 hours)  MRSA Next Gen by PCR, Nasal     Status: None   Collection Time: 02/22/24  6:15 PM   Specimen: Nasal Mucosa; Nasal Swab  Result Value Ref Range   MRSA by PCR Next Gen NOT DETECTED NOT DETECTED    Comment: (NOTE) The GeneXpert MRSA Assay (FDA approved for NASAL specimens only), is one component of a comprehensive MRSA colonization surveillance program. It is not intended to diagnose MRSA infection nor to guide or monitor treatment for MRSA infections. Test performance is not FDA approved in patients less than 59 years old. Performed at [facility], [address] [place], [place] [address]   Glucose, capillary     Status: Abnormal   Collection Time: 02/22/24  9:14 PM  Result Value Ref Range   Glucose-Capillary 143 (H) 70 - 99 mg/dL    Comment: Glucose reference range applies only to samples taken after fasting for at least 8 hours.  CBC with Differential/Platelet     Status: Abnormal   Collection Time: 02/23/24  4:14 AM  Result Value Ref Range   WBC 16.6 (H) 4.0 - 10.5 K/uL   RBC 4.44 4.22 - 5.81 MIL/uL   Hemoglobin 12.3 (L) 13.0 - 17.0 g/dL   HCT 60.4 (L) 54.0 - 98.1 %   MCV 84.5 80.0 - 100.0 fL   MCH 27.7 26.0 - 34.0 pg   MCHC 32.8 30.0 - 36.0 g/dL   RDW 19.1 47.8 - 29.5 %   Platelets 205 150 - 400 K/uL   nRBC 0.0 0.0 - 0.2 %   Neutrophils Relative % 82 %   Neutro Abs 13.5 (H) 1.7 - 7.7 K/uL   Lymphocytes Relative 5 %   Lymphs Abs 0.8 0.7 - 4.0 K/uL   Monocytes Relative 9 %   Monocytes Absolute 1.5 (H) 0.1 - 1.0 K/uL   Eosinophils Relative 1 %   Eosinophils Absolute 0.2 0.0 - 0.5 K/uL   Basophils Relative 0 %   Basophils Absolute 0.1 0.0 - 0.1 K/uL   Immature Granulocytes 3 %   Abs Immature Granulocytes 0.54 (H) 0.00 - 0.07 K/uL    Comment: Performed at [facility], [address]  [place], [place] [address]  Basic metabolic panel     Status: Abnormal   Collection Time: 02/23/24  4:14 AM  Result Value Ref Range   Sodium 131 (L) 135 - 145 mmol/L   Potassium 3.9 3.5 - 5.1 mmol/L   Chloride 98 98 - 111 mmol/L   CO2 26 22 - 32 mmol/L   Glucose, Bld 132 (H) 70 - 99 mg/dL  Comment: Glucose reference range applies only to samples taken after fasting for at least 8 hours.   BUN 14 6 - 20 mg/dL   Creatinine, Ser 8.29 0.61 - 1.24 mg/dL   Calcium 8.3 (L) 8.9 - 10.3 mg/dL   GFR, Estimated >56 >21 mL/min    Comment: (NOTE) Calculated using the CKD-EPI Creatinine Equation (2021)    Anion gap 7 5 - 15    Comment: Performed at [facility], [address] [place], [place] [address]  CBC with Differential/Platelet     Status: Abnormal   Collection Time: 02/24/24  3:25 AM  Result Value Ref Range   WBC 16.1 (H) 4.0 - 10.5 K/uL   RBC 4.48 4.22 - 5.81 MIL/uL   Hemoglobin 12.5 (L) 13.0 - 17.0 g/dL   HCT 78.4 (L) 69.6 - 29.5 %   MCV 84.2 80.0 - 100.0 fL   MCH 27.9 26.0 - 34.0 pg   MCHC 33.2 30.0 - 36.0 g/dL   RDW 28.4 13.2 - 44.0 %   Platelets 251 150 - 400 K/uL   nRBC 0.0 0.0 - 0.2 %   Neutrophils Relative % 79 %   Neutro Abs 12.7 (H) 1.7 - 7.7 K/uL   Lymphocytes Relative 5 %   Lymphs Abs 0.9 0.7 - 4.0 K/uL   Monocytes Relative 9 %   Monocytes Absolute 1.5 (H) 0.1 - 1.0 K/uL   Eosinophils Relative 2 %   Eosinophils Absolute 0.3 0.0 - 0.5 K/uL   Basophils Relative 1 %   Basophils Absolute 0.1 0.0 - 0.1 K/uL   Immature Granulocytes 4 %   Abs Immature Granulocytes 0.70 (H) 0.00 - 0.07 K/uL    Comment: Performed at [facility], [address] [place], [place] [address]  Comprehensive metabolic panel     Status: Abnormal   Collection Time: 02/24/24  3:25 AM  Result Value Ref Range   Sodium 132 (L) 135 - 145 mmol/L   Potassium 3.5 3.5 - 5.1 mmol/L   Chloride 96 (L) 98 - 111 mmol/L   CO2 26 22 - 32 mmol/L   Glucose, Bld 154 (H) 70 - 99 mg/dL     Comment: Glucose reference range applies only to samples taken after fasting for at least 8 hours.   BUN 18 6 - 20 mg/dL   Creatinine, Ser 5.36 0.61 - 1.24 mg/dL   Calcium 8.3 (L) 8.9 - 10.3 mg/dL   Total Protein 6.6 6.5 - 8.1 g/dL   Albumin 2.8 (L) 3.5 - 5.0 g/dL   AST 78 (H) 15 - 41 U/L   ALT 55 (H) 0 - 44 U/L   Alkaline Phosphatase 110 38 - 126 U/L   Total Bilirubin 0.9 0.0 - 1.2 mg/dL   GFR, Estimated >64 >40 mL/min    Comment: (NOTE) Calculated using the CKD-EPI Creatinine Equation (2021)    Anion gap 10 5 - 15    Comment: Performed at [facility], [address] [place], [place] [address]   No components found for: "ESR", "C REACTIVE PROTEIN" MICRO: Recent Results (from the past 720 hours)  Resp panel by RT-PCR (RSV, Flu A&B, Covid) Anterior Nasal Swab     Status: None   Collection Time: 02/20/24  6:57 PM   Specimen: Anterior Nasal Swab  Result Value Ref Range Status   SARS Coronavirus 2 by RT PCR NEGATIVE NEGATIVE Final    Comment: (NOTE) SARS-CoV-2 target nucleic acids are NOT DETECTED.  The SARS-CoV-2 RNA is generally detectable  in upper respiratory specimens during the acute phase of infection. The lowest concentration of SARS-CoV-2 viral copies this assay can detect is 138 copies/mL. A negative result does not preclude SARS-Cov-2 infection and should not be used as the sole basis for treatment or other patient management decisions. A negative result may occur with  improper specimen collection/handling, submission of specimen other than nasopharyngeal swab, presence of viral mutation(s) within the areas targeted by this assay, and inadequate number of viral copies(<138 copies/mL). A negative result must be combined with clinical observations, patient history, and epidemiological information. The expected result is Negative.  Fact Sheet for Patients:  BloggerCourse.com  Fact Sheet for Healthcare Providers:   SeriousBroker.it  This test is no t yet approved or cleared by the [place]  FDA and  has been authorized for detection and/or diagnosis of SARS-CoV-2 by FDA under an Emergency Use Authorization (EUA). This EUA will remain  in effect (meaning this test can be used) for the duration of the COVID-19 declaration under Section 564(b)(1) of the Act, 21 U.S.C.section 360bbb-3(b)(1), unless the authorization is terminated  or revoked sooner.       Influenza A by PCR NEGATIVE NEGATIVE Final   Influenza B by PCR NEGATIVE NEGATIVE Final    Comment: (NOTE) The Xpert Xpress SARS-CoV-2/FLU/RSV plus assay is intended as an aid in the diagnosis of influenza from Nasopharyngeal swab specimens and should not be used as a sole basis for treatment. Nasal washings and aspirates are unacceptable for Xpert Xpress SARS-CoV-2/FLU/RSV testing.  Fact Sheet for Patients: BloggerCourse.com  Fact Sheet for Healthcare Providers: SeriousBroker.it  This test is not yet approved or cleared by the [place]  FDA and has been authorized for detection and/or diagnosis of SARS-CoV-2 by FDA under an Emergency Use Authorization (EUA). This EUA will remain in effect (meaning this test can be used) for the duration of the COVID-19 declaration under Section 564(b)(1) of the Act, 21 U.S.C. section 360bbb-3(b)(1), unless the authorization is terminated or revoked.     Resp Syncytial Virus by PCR NEGATIVE NEGATIVE Final    Comment: (NOTE) Fact Sheet for Patients: BloggerCourse.com  Fact Sheet for Healthcare Providers: SeriousBroker.it  This test is not yet approved or cleared by the [place]  FDA and has been authorized for detection and/or diagnosis of SARS-CoV-2 by FDA under an Emergency Use Authorization (EUA). This EUA will remain in effect (meaning this test can be used) for  the duration of the COVID-19 declaration under Section 564(b)(1) of the Act, 21 U.S.C. section 360bbb-3(b)(1), unless the authorization is terminated or revoked.  Performed at [facility], [address] [place], [place] [address]   Blood culture (routine x 2)     Status: None (Preliminary result)   Collection Time: 02/20/24  6:57 PM   Specimen: BLOOD  Result Value Ref Range Status   Specimen Description BLOOD RIGHT FA  Final   Special Requests   Final    BOTTLES DRAWN AEROBIC AND ANAEROBIC Blood Culture results may not be optimal due to an inadequate volume of blood received in culture bottles   Culture   Final    NO GROWTH 4 DAYS Performed at [facility], [address] [place], [place] [address]    Report Status PENDING  Incomplete  Blood culture (routine x 2)     Status: None (Preliminary result)   Collection Time: 02/20/24  6:57 PM   Specimen: BLOOD  Result Value Ref Range Status   Specimen Description BLOOD RIGTH [facility]  Final  Special Requests   Final    BOTTLES DRAWN AEROBIC AND ANAEROBIC Blood Culture results may not be optimal due to an inadequate volume of blood received in culture bottles   Culture   Final    NO GROWTH 4 DAYS Performed at [facility], [address] [place], [place] [address]    Report Status PENDING  Incomplete  C Difficile Quick Screen w PCR reflex     Status: None   Collection Time: 02/21/24  6:30 PM   Specimen: STOOL  Result Value Ref Range Status   C Diff antigen NEGATIVE NEGATIVE Final   C Diff toxin NEGATIVE NEGATIVE Final   C Diff interpretation No C. difficile detected.  Final    Comment: Performed at [facility], [address] [place], [place] [address]  MRSA Next Gen by PCR, Nasal     Status: None   Collection Time: 02/22/24  6:15 PM   Specimen: Nasal Mucosa; Nasal Swab  Result Value Ref Range Status   MRSA by PCR Next Gen NOT DETECTED NOT DETECTED Final    Comment: (NOTE) The GeneXpert  MRSA Assay (FDA approved for NASAL specimens only), is one component of a comprehensive MRSA colonization surveillance program. It is not intended to diagnose MRSA infection nor to guide or monitor treatment for MRSA infections. Test performance is not FDA approved in patients less than 58 years old. Performed at [facility], [address] [place], [place] [address]     IMAGING: [place] Chest 2 View Result Date: 02/23/2024 CLINICAL DATA:  Pneumonia EXAM: CHEST - 2 VIEW COMPARISON:  02/20/2024 FINDINGS: Large right-sided pleural effusion. Linear basilar opacities consistent with subsegmental atelectasis. Normal pulmonary vasculature. No pneumothorax. Cardiac silhouette is obscured. IMPRESSION: No active cardiopulmonary disease. Electronically Signed   By: Sydell Eva M.D.   On: 02/23/2024 09:13   US  ABDOMEN LIMITED RUQ (LIVER/GB) Result Date: 02/20/2024 CLINICAL DATA:  Abdominal pain. EXAM: ULTRASOUND ABDOMEN LIMITED RIGHT UPPER QUADRANT COMPARISON:  Abdominal ultrasound 04/07/2007. CT abdomen and pelvis 01/14/2008. FINDINGS: Gallbladder: No gallstones or wall thickening visualized. No sonographic Murphy sign noted by sonographer. Common bile duct: Diameter: 2.3 mm Liver: No focal lesion identified. Increase in parenchymal echogenicity. Portal vein is patent on color Doppler imaging with normal direction of blood flow towards the liver. Other: There is a small amount of free fluid in the right upper quadrant. IMPRESSION: 1. No cholelithiasis or sonographic evidence for acute cholecystitis. 2. Small amount of free fluid in the right upper quadrant. 3. Increased hepatic parenchymal echogenicity suggestive of steatosis. Electronically Signed   By: Tyron Gallon M.D.   On: 02/20/2024 20:54   CT Angio Chest PE W and/or Wo Contrast Result Date: 02/20/2024 CLINICAL DATA:  Pulmonary embolus suspected with high probability. Right-sided chest pain radiating to the back starting about 1 hour ago.  Shortness of breath. EXAM: CT ANGIOGRAPHY CHEST WITH CONTRAST TECHNIQUE: Multidetector CT imaging of the chest was performed using the standard protocol during bolus administration of intravenous contrast. Multiplanar CT image reconstructions and MIPs were obtained to evaluate the vascular anatomy. RADIATION DOSE REDUCTION: This exam was performed according to the departmental dose-optimization program which includes automated exposure control, adjustment of the mA and/or kV according to patient size and/or use of iterative reconstruction technique. CONTRAST:  OMNIPAQUE  IOHEXOL  350 MG/ML SOLN COMPARISON:  Chest radiograph 02/20/2024 FINDINGS: Cardiovascular: Technically adequate study with good opacification of the central and segmental pulmonary arteries. Mild motion artifact. No focal filling defects. No evidence  of significant pulmonary embolus. Normal heart size. No pericardial effusions. Normal caliber thoracic aorta. No dissection. Great vessel origins are patent. Mediastinum/Nodes: Small esophageal hiatal hernia. Esophagus is decompressed. Mediastinal lymph nodes are not pathologically enlarged, likely reactive. Thyroid gland is unremarkable. Lungs/Pleura: Atelectasis or infiltration in both lung bases, possibly compressive atelectasis or pneumonia. 5 mm nodule in the right lower lung adjacent to the fissure probably representing inter fissural lymph node. Small right pleural effusion. No pneumothorax. Upper Abdomen: Small amount of free fluid around the liver edge, likely ascites. No acute abnormality is otherwise identified. Musculoskeletal: No chest wall abnormality. No acute or significant osseous findings. Review of the MIP images confirms the above findings. IMPRESSION: 1. No evidence of significant pulmonary embolus. 2. Infiltration or atelectasis in the lung bases. Small right pleural effusion. 3. Small esophageal hiatal hernia. 4. Minimal ascites around the liver edge. Electronically Signed    By: Boyce Byes M.D.   On: 02/20/2024 18:47   DG Chest Port 1 View Result Date: 02/20/2024 CLINICAL DATA:  Chest pain. Right-sided chest pain radiating to the back starting about our ago. Shortness of breath and sweating. EXAM: PORTABLE CHEST 1 VIEW COMPARISON:  02/13/2024 FINDINGS: Mild cardiac enlargement. Shallow inspiration. Linear atelectasis or infiltration in the lung bases, new since prior study. No pleural effusion or pneumothorax. Mediastinal contours appear intact. IMPRESSION: Shallow inspiration with atelectasis or infiltration in the lung bases, new since prior study. Mild cardiac enlargement. Electronically Signed   By: Boyce Byes M.D.   On: 02/20/2024 17:04   DG Ribs Unilateral W/Chest Right Result Date: 02/13/2024 CLINICAL DATA:  Palpable knot over the right ribs. EXAM: RIGHT RIBS AND CHEST - 3+ VIEW COMPARISON:  Chest radiograph dated 01/03/2008. FINDINGS: No fracture or other bone lesions are seen involving the ribs. There is no evidence of pneumothorax or pleural effusion. Both lungs are clear. Heart size and mediastinal contours are within normal limits. IMPRESSION: Negative. Electronically Signed   By: Angus Bark M.D.   On: 02/13/2024 10:21    Assessment:   Andrew Khan is a 48 y.o. male with PNA and now persistent fever and leukocystosi. CXR shows large R effusion- likely empyema or parapneumonic effusion.  Recommendations Would suggest thoracentesis- send for lights criteria, pH and cultures for routine, AFB and Fungal Cont zosyn  Thank you very much for allowing me to participate in the care of this patient. Please call with questions.   Merri Abbe. Harwood Lingo, MD

## 2024-02-24 NOTE — Progress Notes (Addendum)
 Progress Note    Andrew Khan  ZOX:[id number] DOB: 09/17/1976  DOA: 02/20/2024 PCP: Patient, No Pcp Per      Brief Narrative:    Medical records reviewed and are as summarized below:  Andrew Khan is a 48 y.o. male with no significant past medical history presented to the hospital with right-sided lower chest pain, 2 weeks duration and acute onset of shortness of breath that started on the day of admission.   He said he had injury to the right side of his rib and saw a chiropractor for manipulation about a week prior to admission.  In the ED, he was diaphoretic, tachypneic and tachycardic.  He subsequently became febrile and hypoxic with oxygen saturation of 88% on room air.       Assessment/Plan:   Principal Problem:   Severe sepsis (HCC) Active Problems:   CAP (community acquired pneumonia)   Chest pain   Elevated bilirubin   Hypertensive urgency   Hyperglycemia   Sepsis (HCC)    Body mass index is 30.09 kg/m.  (Class I obesity)    Severe sepsis secondary to community-acquired pneumonia, recurrent fever, leukocytosis, large right pleural effusion: He had fever of 101.9 F today.  Repeat blood cultures.  Check acute hepatitis panel.  Obtain 2D echo for further evaluation Consulted ID specialist, Dr. Harwood Lingo, to assist with management. Large right pleural effusion on chest x-ray may be complicated pneumonia. CT chest has been ordered for further evaluation. Right-sided thoracentesis has been ordered as well. Consulted Dr. Aleskerov, pulmonologist, to assist with management. MRSA screen was negative.  Repeat chest x-ray on 02/23/2024 showed right pleural effusion.  Restart IV Ringers  lactate infusion given recurrent fever.  Continue IV Zosyn.  Continue oral azithromycin .  Strep pneumo and Legionella urine antigen tests were negative.   Lactic acid was 2.4, 2.6. WBC down from 21.5-14.6-14.5-16.6-16.1 .   Right-sided pleuritic chest pain: Likely due  to pneumonia No evidence of pulmonary embolism on CT chest.   Acute hypoxic respiratory failure: Improved.  He is tolerating room air. Oxygen saturation was 88% on room air with ambulation (took 4 laps around the unit).   Hypertensive urgency: BP is better.  Hold losartan for now.     Hyperglycemia, prediabetes: Glucose levels are better.  Hemoglobin A1c 5.7 suggestive of prediabetes   Hyponatremia: Improved from 128-131.     Acute diarrheal illness: Likely from antibiotics.  Stool for C. difficile toxin was negative.  Improved.   Elevated liver enzymes: AST and ALT levels up to 78 and 55 respectively.  Check hepatitis panel Mild hyperbilirubinemia: Resolved.  Probably from sepsis.  Liver ultrasound showed steatosis.    ADDENDUM  Plan of care was discussed with his wife over the phone.  Patient and wife requested transfer to [facility].  [facility] transfer [facility] to discuss the case.  I spoke with Ethyl Hering, the transfer coordinator, who said she will get back to me.  She called back and said they were unable to accept the patient at this time because of "lack of capacity and lack of higher level of care".  Patient's wife has been updated.    Diet Order             Diet regular Room service appropriate? Yes; Fluid consistency: Thin  Diet effective now                            Consultants: ID  specialist Pulmonologist  Procedures: None    Medications:    azithromycin   500 mg Oral Daily   enoxaparin  (LOVENOX ) injection  40 mg Subcutaneous Q24H   feeding supplement  237 mL Oral BID BM   guaiFENesin   600 mg Oral BID   lidocaine   1 patch Transdermal Q24H   Continuous Infusions:  piperacillin-tazobactam (ZOSYN)  IV 3.375 g (02/24/24 1050)     Anti-infectives (From admission, onward)    Start     Dose/Rate Route Frequency Ordered Stop   02/24/24 1800  azithromycin  (ZITHROMAX ) tablet 500 mg        500 mg Oral Daily 02/24/24 0752 02/26/24 1759    02/22/24 1900  piperacillin-tazobactam (ZOSYN) IVPB 3.375 g        3.375 g 12.5 mL/hr over 240 Minutes Intravenous Every 8 hours 02/22/24 1727     02/21/24 1800  cefTRIAXone  (ROCEPHIN ) 2 g in sodium chloride  0.9 % 100 mL IVPB  Status:  Discontinued        2 g 200 mL/hr over 30 Minutes Intravenous Every 24 hours 02/20/24 1957 02/22/24 1725   02/21/24 1800  azithromycin  (ZITHROMAX ) 500 mg in sodium chloride  0.9 % 250 mL IVPB  Status:  Discontinued        500 mg 250 mL/hr over 60 Minutes Intravenous Every 24 hours 02/20/24 1957 02/24/24 0753   02/20/24 1815  cefTRIAXone  (ROCEPHIN ) 2 g in sodium chloride  0.9 % 100 mL IVPB        2 g 200 mL/hr over 30 Minutes Intravenous Once 02/20/24 1807 02/20/24 1934   02/20/24 1815  azithromycin  (ZITHROMAX ) 500 mg in sodium chloride  0.9 % 250 mL IVPB        500 mg 250 mL/hr over 60 Minutes Intravenous  Once 02/20/24 1807 02/20/24 2054              Family Communication/Anticipated D/C date and plan/Code Status   DVT prophylaxis: enoxaparin  (LOVENOX ) injection 40 mg Start: 02/20/24 2200     Code Status: Full Code  Family Communication: Plan discussed with his wife over the phone Disposition Plan: Plan to discharge home   Status is: Inpatient Remains inpatient appropriate because: Sepsis from pneumonia, recurrent fever         Subjective:   Interval events noted.  He said this is the best he has felt since admission.  Of note, this was early this morning when I saw him on rounds.  Later in the day, he complained of chest discomfort.  He spiked a temperature in the afternoon with temperature of 101.9 F.  Objective:    Vitals:   02/24/24 0318 02/24/24 0347 02/24/24 0747 02/24/24 1535  BP:   (!) 144/88 (!) 160/84  Pulse:   (!) 110 (!) 124  Resp:   20 18  Temp:   99.1 F (37.3 C) (!) 101.9 F (38.8 C)  TempSrc:   Oral Oral  SpO2: 98% 93% 94% 90%  Weight:      Height:       No data found.   Intake/Output Summary (Last  24 hours) at 02/24/2024 1641 Last data filed at 02/24/2024 0900 Gross per 24 hour  Intake 240 ml  Output --  Net 240 ml    Filed Weights   02/20/24 1641 02/20/24 2100  Weight: 88.5 kg 95.1 kg    Exam:  GEN: NAD SKIN: Warm and dry EYES: No pallor or icterus ENT: MMM CV: RRR, tachycardic PULM: CTA B ABD: soft, ND, NT, +BS CNS:  AAO x 3, non focal EXT: No edema or tenderness      Data Reviewed:   I have personally reviewed following labs and imaging studies:  Labs: Labs show the following:   Basic Metabolic Panel: Recent Labs  Lab 02/20/24 1644 02/21/24 0519 02/22/24 0429 02/23/24 0414 02/24/24 0325  NA 131* 133* 128* 131* 132*  K 3.7 4.0 3.5 3.9 3.5  CL 95* 101 95* 98 96*  CO2 23 23 22 26 26   GLUCOSE 218* 160* 153* 132* 154*  BUN 12 14 17 14 18   CREATININE 1.17 0.94 0.89 0.93 1.06  CALCIUM 8.9 7.8* 8.1* 8.3* 8.3*  MG 1.8  --   --   --   --    GFR Estimated Creatinine Clearance: 101.8 mL/min (by C-G formula based on SCr of 1.06 mg/dL). Liver Function Tests: Recent Labs  Lab 02/20/24 1644 02/21/24 0519 02/24/24 0325  AST 30 24 78*  ALT 36 28 55*  ALKPHOS 67 53 110  BILITOT 1.4* 1.5* 0.9  PROT 8.7* 6.8 6.6  ALBUMIN 4.2 3.1* 2.8*   Recent Labs  Lab 02/20/24 1644  LIPASE 31   No results for input(s): "AMMONIA" in the last 168 hours. Coagulation profile No results for input(s): "INR", "PROTIME" in the last 168 hours.  CBC: Recent Labs  Lab 02/20/24 1644 02/21/24 0519 02/22/24 0429 02/23/24 0414 02/24/24 0325  WBC 21.5* 14.6* 14.5* 16.6* 16.1*  NEUTROABS  --   --  12.9* 13.5* 12.7*  HGB 15.7 13.9 12.8* 12.3* 12.5*  HCT 46.4 40.7 37.2* 37.5* 37.7*  MCV 83.6 84.1 82.9 84.5 84.2  PLT 260 181 170 205 251   Cardiac Enzymes: No results for input(s): "CKTOTAL", "CKMB", "CKMBINDEX", "TROPONINI" in the last 168 hours. BNP (last 3 results) No results for input(s): "PROBNP" in the last 8760 hours. CBG: Recent Labs  Lab 02/22/24 2114  GLUCAP  143*   D-Dimer: No results for input(s): "DDIMER" in the last 72 hours.  Hgb A1c: No results for input(s): "HGBA1C" in the last 72 hours.  Lipid Profile: No results for input(s): "CHOL", "HDL", "LDLCALC", "TRIG", "CHOLHDL", "LDLDIRECT" in the last 72 hours. Thyroid function studies: No results for input(s): "TSH", "T4TOTAL", "T3FREE", "THYROIDAB" in the last 72 hours.  Invalid input(s): "FREET3"  Anemia work up: No results for input(s): "VITAMINB12", "FOLATE", "FERRITIN", "TIBC", "IRON", "RETICCTPCT" in the last 72 hours. Sepsis Labs: Recent Labs  Lab 02/20/24 1857 02/20/24 2027 02/21/24 0519 02/22/24 0429 02/23/24 0414 02/24/24 0325  WBC  --   --  14.6* 14.5* 16.6* 16.1*  LATICACIDVEN 2.4* 2.6*  --   --   --   --     Microbiology Recent Results (from the past 240 hours)  Resp panel by RT-PCR (RSV, Flu A&B, Covid) Anterior Nasal Swab     Status: None   Collection Time: 02/20/24  6:57 PM   Specimen: Anterior Nasal Swab  Result Value Ref Range Status   SARS Coronavirus 2 by RT PCR NEGATIVE NEGATIVE Final    Comment: (NOTE) SARS-CoV-2 target nucleic acids are NOT DETECTED.  The SARS-CoV-2 RNA is generally detectable in upper respiratory specimens during the acute phase of infection. The lowest concentration of SARS-CoV-2 viral copies this assay can detect is 138 copies/mL. A negative result does not preclude SARS-Cov-2 infection and should not be used as the sole basis for treatment or other patient management decisions. A negative result may occur with  improper specimen collection/handling, submission of specimen other than nasopharyngeal swab, presence of viral  mutation(s) within the areas targeted by this assay, and inadequate number of viral copies(<138 copies/mL). A negative result must be combined with clinical observations, patient history, and epidemiological information. The expected result is Negative.  Fact Sheet for Patients:   BloggerCourse.com  Fact Sheet for Healthcare Providers:  SeriousBroker.it  This test is no t yet approved or cleared by the [place]  FDA and  has been authorized for detection and/or diagnosis of SARS-CoV-2 by FDA under an Emergency Use Authorization (EUA). This EUA will remain  in effect (meaning this test can be used) for the duration of the COVID-19 declaration under Section 564(b)(1) of the Act, 21 U.S.C.section 360bbb-3(b)(1), unless the authorization is terminated  or revoked sooner.       Influenza A by PCR NEGATIVE NEGATIVE Final   Influenza B by PCR NEGATIVE NEGATIVE Final    Comment: (NOTE) The Xpert Xpress SARS-CoV-2/FLU/RSV plus assay is intended as an aid in the diagnosis of influenza from Nasopharyngeal swab specimens and should not be used as a sole basis for treatment. Nasal washings and aspirates are unacceptable for Xpert Xpress SARS-CoV-2/FLU/RSV testing.  Fact Sheet for Patients: BloggerCourse.com  Fact Sheet for Healthcare Providers: SeriousBroker.it  This test is not yet approved or cleared by the [place]  FDA and has been authorized for detection and/or diagnosis of SARS-CoV-2 by FDA under an Emergency Use Authorization (EUA). This EUA will remain in effect (meaning this test can be used) for the duration of the COVID-19 declaration under Section 564(b)(1) of the Act, 21 U.S.C. section 360bbb-3(b)(1), unless the authorization is terminated or revoked.     Resp Syncytial Virus by PCR NEGATIVE NEGATIVE Final    Comment: (NOTE) Fact Sheet for Patients: BloggerCourse.com  Fact Sheet for Healthcare Providers: SeriousBroker.it  This test is not yet approved or cleared by the [place]  FDA and has been authorized for detection and/or diagnosis of SARS-CoV-2 by FDA under an Emergency Use  Authorization (EUA). This EUA will remain in effect (meaning this test can be used) for the duration of the COVID-19 declaration under Section 564(b)(1) of the Act, 21 U.S.C. section 360bbb-3(b)(1), unless the authorization is terminated or revoked.  Performed at [facility], [address] [place], [place] [address]   Blood culture (routine x 2)     Status: None (Preliminary result)   Collection Time: 02/20/24  6:57 PM   Specimen: BLOOD  Result Value Ref Range Status   Specimen Description BLOOD RIGHT FA  Final   Special Requests   Final    BOTTLES DRAWN AEROBIC AND ANAEROBIC Blood Culture results may not be optimal due to an inadequate volume of blood received in culture bottles   Culture   Final    NO GROWTH 4 DAYS Performed at [facility], [address], [place], [place] [address]    Report Status PENDING  Incomplete  Blood culture (routine x 2)     Status: None (Preliminary result)   Collection Time: 02/20/24  6:57 PM   Specimen: BLOOD  Result Value Ref Range Status   Specimen Description BLOOD [facility]  Final   Special Requests   Final    BOTTLES DRAWN AEROBIC AND ANAEROBIC Blood Culture results may not be optimal due to an inadequate volume of blood received in culture bottles   Culture   Final    NO GROWTH 4 DAYS Performed at [facility], [address], [place], [place] [address]    Report Status PENDING  Incomplete  C  Difficile Quick Screen w PCR reflex     Status: None   Collection Time: 02/21/24  6:30 PM   Specimen: STOOL  Result Value Ref Range Status   C Diff antigen NEGATIVE NEGATIVE Final   C Diff toxin NEGATIVE NEGATIVE Final   C Diff interpretation No C. difficile detected.  Final    Comment: Performed at [facility], [address] [place], [place] [address]  MRSA Next Gen by PCR, Nasal     Status: None   Collection Time: 02/22/24  6:15 PM   Specimen: Nasal Mucosa; Nasal Swab  Result Value Ref Range Status    MRSA by PCR Next Gen NOT DETECTED NOT DETECTED Final    Comment: (NOTE) The GeneXpert MRSA Assay (FDA approved for NASAL specimens only), is one component of a comprehensive MRSA colonization surveillance program. It is not intended to diagnose MRSA infection nor to guide or monitor treatment for MRSA infections. Test performance is not FDA approved in patients less than 90 years old. Performed at [facility], [address] [place], [place] [address]     Procedures and diagnostic studies:  DG Chest 2 View Result Date: 02/23/2024 CLINICAL DATA:  Pneumonia EXAM: CHEST - 2 VIEW COMPARISON:  02/20/2024 FINDINGS: Large right-sided pleural effusion. Linear basilar opacities consistent with subsegmental atelectasis. Normal pulmonary vasculature. No pneumothorax. Cardiac silhouette is obscured. IMPRESSION: No active cardiopulmonary disease. Electronically Signed   By: Sydell Eva M.D.   On: 02/23/2024 09:13               LOS: 3 days   Nivedita Mirabella  [facility]   Pager on [email or web address]. If 7PM-7AM, please contact night-coverage at [email or web address]     02/24/2024, 4:41 PM

## 2024-02-24 NOTE — Plan of Care (Signed)
  Problem: Clinical Measurements: Goal: Diagnostic test results will improve Outcome: Progressing   Problem: Respiratory: Goal: Ability to maintain adequate ventilation will improve Outcome: Progressing   Problem: Education: Goal: Knowledge of General Education information will improve Description: Including pain rating scale, medication(s)/side effects and non-pharmacologic comfort measures Outcome: Progressing   Problem: Health Behavior/Discharge Planning: Goal: Ability to manage health-related needs will improve Outcome: Progressing   Problem: Clinical Measurements: Goal: Ability to maintain clinical measurements within normal limits will improve Outcome: Progressing

## 2024-02-25 ENCOUNTER — Inpatient Hospital Stay

## 2024-02-25 DIAGNOSIS — A419 Sepsis, unspecified organism: Secondary | ICD-10-CM | POA: Diagnosis not present

## 2024-02-25 DIAGNOSIS — R6520 Severe sepsis without septic shock: Secondary | ICD-10-CM | POA: Diagnosis not present

## 2024-02-25 LAB — CBC WITH DIFFERENTIAL/PLATELET
Abs Immature Granulocytes: 1.11 10*3/uL — ABNORMAL HIGH (ref 0.00–0.07)
Basophils Absolute: 0.1 10*3/uL (ref 0.0–0.1)
Basophils Relative: 1 %
Eosinophils Absolute: 0.3 10*3/uL (ref 0.0–0.5)
Eosinophils Relative: 2 %
HCT: 36.8 % — ABNORMAL LOW (ref 39.0–52.0)
Hemoglobin: 12.5 g/dL — ABNORMAL LOW (ref 13.0–17.0)
Immature Granulocytes: 6 %
Lymphocytes Relative: 6 %
Lymphs Abs: 1 10*3/uL (ref 0.7–4.0)
MCH: 28.5 pg (ref 26.0–34.0)
MCHC: 34 g/dL (ref 30.0–36.0)
MCV: 84 fL (ref 80.0–100.0)
Monocytes Absolute: 1.8 10*3/uL — ABNORMAL HIGH (ref 0.1–1.0)
Monocytes Relative: 10 %
Neutro Abs: 13 10*3/uL — ABNORMAL HIGH (ref 1.7–7.7)
Neutrophils Relative %: 75 %
Platelets: 247 10*3/uL (ref 150–400)
RBC: 4.38 MIL/uL (ref 4.22–5.81)
RDW: 12.8 % (ref 11.5–15.5)
Smear Review: NORMAL
WBC: 17.3 10*3/uL — ABNORMAL HIGH (ref 4.0–10.5)
nRBC: 0 % (ref 0.0–0.2)

## 2024-02-25 LAB — HEPATITIS PANEL, ACUTE
HCV Ab: NONREACTIVE
Hep A IgM: NONREACTIVE
Hep B C IgM: NONREACTIVE

## 2024-02-25 LAB — CULTURE, BLOOD (ROUTINE X 2)
Culture: NO GROWTH
Culture: NO GROWTH

## 2024-02-25 LAB — COMPREHENSIVE METABOLIC PANEL WITH GFR
ALT: 58 U/L — ABNORMAL HIGH (ref 0–44)
AST: 70 U/L — ABNORMAL HIGH (ref 15–41)
Albumin: 2.7 g/dL — ABNORMAL LOW (ref 3.5–5.0)
Alkaline Phosphatase: 107 U/L (ref 38–126)
Anion gap: 12 (ref 5–15)
BUN: 21 mg/dL — ABNORMAL HIGH (ref 6–20)
CO2: 26 mmol/L (ref 22–32)
Calcium: 8.1 mg/dL — ABNORMAL LOW (ref 8.9–10.3)
Chloride: 93 mmol/L — ABNORMAL LOW (ref 98–111)
Creatinine, Ser: 0.89 mg/dL (ref 0.61–1.24)
GFR, Estimated: >60 mL/min (ref >60)
Glucose, Bld: 142 mg/dL — ABNORMAL HIGH (ref 70–99)
Potassium: 3.3 mmol/L — ABNORMAL LOW (ref 3.5–5.1)
Sodium: 131 mmol/L — ABNORMAL LOW (ref 135–145)
Total Bilirubin: 1 mg/dL (ref 0.0–1.2)
Total Protein: 6.8 g/dL (ref 6.5–8.1)

## 2024-02-25 LAB — LACTATE DEHYDROGENASE, PLEURAL OR PERITONEAL FLUID: LD, Fluid: 488 U/L — ABNORMAL HIGH (ref 3–23)

## 2024-02-25 LAB — LACTATE DEHYDROGENASE: LDH: 174 U/L (ref 98–192)

## 2024-02-25 LAB — C-REACTIVE PROTEIN: CRP: 25.8 mg/dL — ABNORMAL HIGH (ref <1.0)

## 2024-02-25 LAB — BODY FLUID CELL COUNT WITH DIFFERENTIAL
Eos, Fluid: 0 %
Lymphs, Fluid: 3 %
Monocyte-Macrophage-Serous Fluid: 8 %
Neutrophil Count, Fluid: 89 %
Total Nucleated Cell Count, Fluid: 6929 uL

## 2024-02-25 LAB — LACTIC ACID, PLASMA: Lactic Acid, Venous: 1 mmol/L (ref 0.5–1.9)

## 2024-02-25 LAB — PROTEIN, PLEURAL OR PERITONEAL FLUID: Total protein, fluid: 4.3 g/dL

## 2024-02-25 LAB — GLUCOSE, PLEURAL OR PERITONEAL FLUID: Glucose, Fluid: 108 mg/dL

## 2024-02-25 MED ORDER — LIDOCAINE HCL (PF) 1 % IJ SOLN
10.0000 mL | Freq: Once | INTRAMUSCULAR | Status: AC
  Administered 2024-02-25: 10 mL via INTRADERMAL
  Filled 2024-02-25: qty 10

## 2024-02-25 MED ORDER — POTASSIUM CHLORIDE CRYS ER 20 MEQ PO TBCR
40.0000 meq | EXTENDED_RELEASE_TABLET | Freq: Once | ORAL | Status: AC
  Administered 2024-02-25: 40 meq via ORAL
  Filled 2024-02-25: qty 2
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
## 2024-02-25 NOTE — Progress Notes (Signed)
 INFECTIOUS DISEASE PROGRESS NOTE Date of Admission:  02/20/2024     ID: Andrew Khan is a 48 y.o. male with  PNA, pleural effusion and  Principal Problem:   Severe sepsis (HCC) Active Problems:   CAP (community acquired pneumonia)   Hyperglycemia   Elevated bilirubin   Hypertensive urgency   Chest pain   Sepsis (HCC)   Subjective: S/p thora with 600 cc amber fluid. Feels a little better. No fever. Less chest pain   ROS  Eleven systems are reviewed and negative except per hpi  Medications:  Antibiotics Given (last 72 hours)     Date/Time Action Medication Dose Rate   02/22/24 1654 New Bag/Given   cefTRIAXone  (ROCEPHIN ) 2 g in sodium chloride  0.9 % 100 mL IVPB 2 g 200 mL/hr   02/22/24 1823 New Bag/Given   azithromycin  (ZITHROMAX ) 500 mg in sodium chloride  0.9 % 250 mL IVPB 500 mg 250 mL/hr   02/22/24 2000 New Bag/Given   piperacillin-tazobactam (ZOSYN) IVPB 3.375 g 3.375 g 12.5 mL/hr   02/23/24 0313 New Bag/Given   piperacillin-tazobactam (ZOSYN) IVPB 3.375 g 3.375 g 12.5 mL/hr   02/23/24 1131 New Bag/Given   piperacillin-tazobactam (ZOSYN) IVPB 3.375 g 3.375 g 12.5 mL/hr   02/23/24 1728 New Bag/Given   azithromycin  (ZITHROMAX ) 500 mg in sodium chloride  0.9 % 250 mL IVPB 500 mg 250 mL/hr   02/23/24 1942 New Bag/Given   piperacillin-tazobactam (ZOSYN) IVPB 3.375 g 3.375 g 12.5 mL/hr   02/24/24 0314 New Bag/Given   piperacillin-tazobactam (ZOSYN) IVPB 3.375 g 3.375 g 12.5 mL/hr   02/24/24 1050 New Bag/Given   piperacillin-tazobactam (ZOSYN) IVPB 3.375 g 3.375 g 12.5 mL/hr   02/24/24 2022 Given   azithromycin  (ZITHROMAX ) tablet 500 mg 500 mg    02/24/24 2031 New Bag/Given   piperacillin-tazobactam (ZOSYN) IVPB 3.375 g 3.375 g 12.5 mL/hr   02/25/24 0254 New Bag/Given   piperacillin-tazobactam (ZOSYN) IVPB 3.375 g 3.375 g 12.5 mL/hr       azithromycin   500 mg Oral Daily   enoxaparin  (LOVENOX ) injection  40 mg Subcutaneous Q24H   feeding supplement  237 mL Oral BID BM    guaiFENesin   600 mg Oral BID   lidocaine   1 patch Transdermal Q24H   potassium chloride  40 mEq Oral Once    Objective: Vital signs in last 24 hours: Temp:  [97.9 F (36.6 C)-101.9 F (38.8 C)] 98.3 F (36.8 C) (06/10 0519) Pulse Rate:  [102-124] 102 (06/10 0519) Resp:  [17-20] 20 (06/10 0519) BP: (156-167)/(84-98) 156/96 (06/10 0519) SpO2:  [90 %-98 %] 97 % (06/10 0519) Physical Exam  Constitutional: He is oriented to person, place, and time. He appears well-developed and well-nourished. No distress.  HENT:  Mouth/Throat: Oropharynx is clear and moist. No oropharyngeal exudate.  Cardiovascular: Normal rate, regular rhythm and normal heart sounds. Exam reveals no gallop and no friction rub.  No murmur heard.  Pulmonary/Chest: Dec BS R base Abdominal: Soft. Bowel sounds are normal. He exhibits no distension. There is no tenderness.  Lymphadenopathy:  He has no cervical adenopathy.  Neurological: He is alert and oriented to person, place, and time.  Skin: Skin is warm and dry. No rash noted. No erythema.  Psychiatric: He has a normal mood and affect. His behavior is normal.     Lab Results Recent Labs    02/24/24 0325 02/25/24 0507  WBC 16.1* 17.3*  HGB 12.5* 12.5*  HCT 37.7* 36.8*  NA 132* 131*  K 3.5 3.3*  CL  96* 93*  CO2 26 26  BUN 18 21*  CREATININE 1.06 0.89    Microbiology: Results for orders placed or performed during the hospital encounter of 02/20/24  Resp panel by RT-PCR (RSV, Flu A&B, Covid) Anterior Nasal Swab     Status: None   Collection Time: 02/20/24  6:57 PM   Specimen: Anterior Nasal Swab  Result Value Ref Range Status   SARS Coronavirus 2 by RT PCR NEGATIVE NEGATIVE Final    Comment: (NOTE) SARS-CoV-2 target nucleic acids are NOT DETECTED.  The SARS-CoV-2 RNA is generally detectable in upper respiratory specimens during the acute phase of infection. The lowest concentration of SARS-CoV-2 viral copies this assay can detect is 138  copies/mL. A negative result does not preclude SARS-Cov-2 infection and should not be used as the sole basis for treatment or other patient management decisions. A negative result may occur with  improper specimen collection/handling, submission of specimen other than nasopharyngeal swab, presence of viral mutation(s) within the areas targeted by this assay, and inadequate number of viral copies(<138 copies/mL). A negative result must be combined with clinical observations, patient history, and epidemiological information. The expected result is Negative.  Fact Sheet for Patients:  BloggerCourse.com  Fact Sheet for Healthcare Providers:  SeriousBroker.it  This test is no t yet approved or cleared by the [place]  FDA and  has been authorized for detection and/or diagnosis of SARS-CoV-2 by FDA under an Emergency Use Authorization (EUA). This EUA will remain  in effect (meaning this test can be used) for the duration of the COVID-19 declaration under Section 564(b)(1) of the Act, 21 U.S.C.section 360bbb-3(b)(1), unless the authorization is terminated  or revoked sooner.       Influenza A by PCR NEGATIVE NEGATIVE Final   Influenza B by PCR NEGATIVE NEGATIVE Final    Comment: (NOTE) The Xpert Xpress SARS-CoV-2/FLU/RSV plus assay is intended as an aid in the diagnosis of influenza from Nasopharyngeal swab specimens and should not be used as a sole basis for treatment. Nasal washings and aspirates are unacceptable for Xpert Xpress SARS-CoV-2/FLU/RSV testing.  Fact Sheet for Patients: BloggerCourse.com  Fact Sheet for Healthcare Providers: SeriousBroker.it  This test is not yet approved or cleared by the [place]  FDA and has been authorized for detection and/or diagnosis of SARS-CoV-2 by FDA under an Emergency Use Authorization (EUA). This EUA will remain in effect (meaning  this test can be used) for the duration of the COVID-19 declaration under Section 564(b)(1) of the Act, 21 U.S.C. section 360bbb-3(b)(1), unless the authorization is terminated or revoked.     Resp Syncytial Virus by PCR NEGATIVE NEGATIVE Final    Comment: (NOTE) Fact Sheet for Patients: BloggerCourse.com  Fact Sheet for Healthcare Providers: SeriousBroker.it  This test is not yet approved or cleared by the [place]  FDA and has been authorized for detection and/or diagnosis of SARS-CoV-2 by FDA under an Emergency Use Authorization (EUA). This EUA will remain in effect (meaning this test can be used) for the duration of the COVID-19 declaration under Section 564(b)(1) of the Act, 21 U.S.C. section 360bbb-3(b)(1), unless the authorization is terminated or revoked.  Performed at [facility], [address] [place], [place] [address]   Blood culture (routine x 2)     Status: None   Collection Time: 02/20/24  6:57 PM   Specimen: BLOOD  Result Value Ref Range Status   Specimen Description BLOOD RIGHT FA  Final   Special Requests   Final    BOTTLES  DRAWN AEROBIC AND ANAEROBIC Blood Culture results may not be optimal due to an inadequate volume of blood received in culture bottles   Culture   Final    NO GROWTH 5 DAYS Performed at [facility], [address] [place], [place] [address]    Report Status 02/25/2024 FINAL  Final  Blood culture (routine x 2)     Status: None   Collection Time: 02/20/24  6:57 PM   Specimen: BLOOD  Result Value Ref Range Status   Specimen Description BLOOD RIGTH [facility]  Final   Special Requests   Final    BOTTLES DRAWN AEROBIC AND ANAEROBIC Blood Culture results may not be optimal due to an inadequate volume of blood received in culture bottles   Culture   Final    NO GROWTH 5 DAYS Performed at [facility], [address] [place], [place] [address]    Report Status  02/25/2024 FINAL  Final  C Difficile Quick Screen w PCR reflex     Status: None   Collection Time: 02/21/24  6:30 PM   Specimen: STOOL  Result Value Ref Range Status   C Diff antigen NEGATIVE NEGATIVE Final   C Diff toxin NEGATIVE NEGATIVE Final   C Diff interpretation No C. difficile detected.  Final    Comment: Performed at [facility], [address] [place], [place] [address]  MRSA Next Gen by PCR, Nasal     Status: None   Collection Time: 02/22/24  6:15 PM   Specimen: Nasal Mucosa; Nasal Swab  Result Value Ref Range Status   MRSA by PCR Next Gen NOT DETECTED NOT DETECTED Final    Comment: (NOTE) The GeneXpert MRSA Assay (FDA approved for NASAL specimens only), is one component of a comprehensive MRSA colonization surveillance program. It is not intended to diagnose MRSA infection nor to guide or monitor treatment for MRSA infections. Test performance is not FDA approved in patients less than 23 years old. Performed at [facility], [address] [place], [place] [address]   Culture, blood (Routine X 2) w Reflex to ID Panel     Status: None (Preliminary result)   Collection Time: 02/24/24  5:00 PM   Specimen: BLOOD  Result Value Ref Range Status   Specimen Description BLOOD RIGHT ANTECUBITAL  Final   Special Requests   Final    BOTTLES DRAWN AEROBIC AND ANAEROBIC Blood Culture adequate volume   Culture   Final    NO GROWTH < 24 HOURS Performed at [facility], [address], [place], [place] [address]    Report Status PENDING  Incomplete  Culture, blood (Routine X 2) w Reflex to ID Panel     Status: None (Preliminary result)   Collection Time: 02/24/24  5:15 PM   Specimen: BLOOD  Result Value Ref Range Status   Specimen Description BLOOD BLOOD RIGHT HAND  Final   Special Requests   Final    BOTTLES DRAWN AEROBIC AND ANAEROBIC Blood Culture adequate volume   Culture   Final    NO GROWTH < 24 HOURS Performed at [facility],  [address], [place], [place] [address]    Report Status PENDING  Incomplete    Studies/Results: CT CHEST W CONTRAST Result Date: 02/24/2024 CLINICAL DATA:  Pneumonia, complication suspected, xray done EXAM: CT CHEST WITH CONTRAST TECHNIQUE: Multidetector CT imaging of the chest was performed during intravenous contrast administration. RADIATION DOSE REDUCTION: This exam was performed according to the departmental  dose-optimization program which includes automated exposure control, adjustment of the mA and/or kV according to patient size and/or use of iterative reconstruction technique. CONTRAST:  75mL OMNIPAQUE  IOHEXOL  300 MG/ML  SOLN COMPARISON:  February 23, 2024, February 20, 2024 FINDINGS: Cardiovascular: No cardiomegaly. Trace pericardial effusion. No aortic aneurysm. While the exam was not optimized for the evaluation of the pulmonary arteries, no central pulmonary embolism visualized. Mediastinum/Nodes: No mediastinal mass. Subcentimeter multistation mediastinal lymph nodes measuring up to 8 mm, slightly more prominent than on the prior study. Additionally, a couple of right cardiophrenic lymph nodes are also more prominent measuring 7 mm (axial 83). These are all likely reactive. Lungs/Pleura: The midline trachea and bronchi are patent. Similar right pleural effusion, which is now large in volume, progressed since February 20, 2024. Near complete atelectasis of the right middle and right lower lobes. No pneumothorax. Trace left pleural effusion. Subsegmental atelectasis in the lingula and anterior basal left lower lobe. Patchy peripheral and subpleural based ground-glass airspace opacities in the anterior left upper lobe (axial 29 and 38), are new. Musculoskeletal: No acute fracture or destructive bone lesion. Small volume symmetric bilateral gynecomastia. Upper Abdomen: Interval development of a large amount of perihepatic fluid along the right hepatic lobe, measuring 15.7 cm in AP dimension and 5.8 cm thick  (axial 108). A smaller amount of fluid along the posteromedial right hepatic lobe measures 3.8 cm. IMPRESSION: 1. Large right pleural effusion, significantly increased since February 20, 2024. Multifocal airspace consolidation in the right middle and right lower lobes, likely complete atelectasis. Alternatively, this could represent changes of a worsening multilobar pneumonia. 2. A couple of new peripheral and subpleural patchy ground-glass opacities in the anterior left upper lobe (axial 29, 38). These have the appearance of either bronchopneumonia or small pulmonary infarcts. In the absence of cavitation, septic emboli are felt to be less likely. While no central pulmonary embolus was visualized, the subsegmental branches of the pulmonary arteries are not well evaluated due to the phase of the contrast bolus. 3. Interval development of a large perihepatic fluid collection along the right hepatic lobe, measuring 15.7 in AP dimension and 5.8 cm thick. A smaller amount of fluid along the posteromedial right hepatic lobe is also present. These are favored to represent subcapsular fluid collections. These results will be called to the ordering clinician or representative by the Radiologist Assistant and communication documented in the PACS or [employer]. Electronically Signed   By: Rance Burrows M.D.   On: 02/24/2024 19:26    Assessment/Plan: COOLIDGE GOSSARD is a 48 y.o. male with PNA and now persistent fever and leukocystosi. CXR shows large R effusion- likely empyema or parapneumonic effusion. MRSA PCR was negative. HIV neg, A1c nml, Neg COvid flu RSV, neg strep and legionella urine ag, C diff neg. BCX neg  FU CT shows large effusion and perihepatic fluid collection.   6/10- s/p thora- by lights criteria this is exudative.   ? If perihepatic fluid collection is like a sympathetic effusion? Post thora xray shows still large amount of fluid  Recommendations Consult pulm for decision re drain placement  if needed  Cont zosyn. At this point I expect cx to be negative. Will need somewhat prolonged course of abx until radiographic resolution and normalization of inflammatory markers.  If cultures are negative would usually use augmentin prolonged course.   Thank you very much for the consult. Will follow with you.  Eartha Gold   02/25/2024, 8:32 AM

## 2024-02-25 NOTE — Progress Notes (Signed)
 Progress Note    Andrew Khan  ZOX:[id number] DOB: Mar 31, 1976  DOA: 02/20/2024 PCP: Patient, No Pcp Per      Brief Narrative:    Medical records reviewed and are as summarized below:  Andrew Khan is a 48 y.o. male with no significant past medical history presented to the hospital with right-sided lower chest pain, 2 weeks duration and acute onset of shortness of breath that started on the day of admission.   He said he had injury to the right side of his rib and saw a chiropractor for manipulation about a week prior to admission.  In the ED, he was diaphoretic, tachypneic and tachycardic.  He subsequently became febrile and hypoxic with oxygen saturation of 88% on room air.       Assessment/Plan:   Principal Problem:   Severe sepsis (HCC) Active Problems:   CAP (community acquired pneumonia)   Chest pain   Elevated bilirubin   Hypertensive urgency   Hyperglycemia   Sepsis (HCC)    Body mass index is 30.09 kg/m.  (Class I obesity)    Severe sepsis secondary to community-acquired pneumonia, recurrent fever, leukocytosis, large right pleural effusion:  S/p right thoracentesis with removal of 650 mL of amber fluid on 03/06/2024.  Pleural fluid is exudative likely from parapneumonic effusion.  Follow-up pleural fluid studies including cultures. Continue IV Zosyn and oral azithromycin . Follow up with ID and pulmonologist. No growth thus far on repeat blood culture from 02/24/2024 MRSA screen was negative. Strep pneumo and Legionella urine antigen tests were negative.   Lactic acid was 2.4, 2.6. WBC down from 21.5-14.6-14.5-16.6-16.1-17.3 .   Right-sided pleuritic chest pain: Likely due to pneumonia No evidence of pulmonary embolism on CT chest.   Acute hypoxic respiratory failure: Improved.     Hypertensive urgency: BP is better.  Hold losartan for now.     Hyperglycemia, prediabetes: Glucose levels are better.  Hemoglobin A1c 5.7 suggestive of  prediabetes   Hyponatremia: Improved from 128-131.   Hypokalemia: Replete potassium and monitor levels   Acute diarrheal illness: Likely from antibiotics.  Stool for C. difficile toxin was negative.  Improved.   Elevated liver enzymes, large perihepatic fluid collection along the right hepatic lobe: Acute hepatitis panel was negative.  Etiology of hepatic fluid collection is not clear.  Discussed with Dr. Aleskerov.  May be related to acute right pleural effusion/pneumonia.   Mild hyperbilirubinemia: Resolved.  Probably from sepsis.  Liver ultrasound showed steatosis.     Diet Order             Diet regular Room service appropriate? Yes; Fluid consistency: Thin  Diet effective now                            Consultants: ID specialist Pulmonologist  Procedures: None    Medications:    azithromycin   500 mg Oral Daily   enoxaparin  (LOVENOX ) injection  40 mg Subcutaneous Q24H   feeding supplement  237 mL Oral BID BM   guaiFENesin   600 mg Oral BID   lidocaine   1 patch Transdermal Q24H   Continuous Infusions:  piperacillin-tazobactam (ZOSYN)  IV 3.375 g (02/25/24 1229)     Anti-infectives (From admission, onward)    Start     Dose/Rate Route Frequency Ordered Stop   02/24/24 1800  azithromycin  (ZITHROMAX ) tablet 500 mg        500 mg Oral Daily 02/24/24 0752 02/26/24 1759  02/22/24 1900  piperacillin-tazobactam (ZOSYN) IVPB 3.375 g        3.375 g 12.5 mL/hr over 240 Minutes Intravenous Every 8 hours 02/22/24 1727     02/21/24 1800  cefTRIAXone  (ROCEPHIN ) 2 g in sodium chloride  0.9 % 100 mL IVPB  Status:  Discontinued        2 g 200 mL/hr over 30 Minutes Intravenous Every 24 hours 02/20/24 1957 02/22/24 1725   02/21/24 1800  azithromycin  (ZITHROMAX ) 500 mg in sodium chloride  0.9 % 250 mL IVPB  Status:  Discontinued        500 mg 250 mL/hr over 60 Minutes Intravenous Every 24 hours 02/20/24 1957 02/24/24 0753   02/20/24 1815  cefTRIAXone  (ROCEPHIN ) 2 g  in sodium chloride  0.9 % 100 mL IVPB        2 g 200 mL/hr over 30 Minutes Intravenous Once 02/20/24 1807 02/20/24 1934   02/20/24 1815  azithromycin  (ZITHROMAX ) 500 mg in sodium chloride  0.9 % 250 mL IVPB        500 mg 250 mL/hr over 60 Minutes Intravenous  Once 02/20/24 1807 02/20/24 2054              Family Communication/Anticipated D/C date and plan/Code Status   DVT prophylaxis: enoxaparin  (LOVENOX ) injection 40 mg Start: 02/20/24 2200     Code Status: Full Code  Family Communication: Plan discussed with his wife at the bedside Disposition Plan: Plan to discharge home   Status is: Inpatient Remains inpatient appropriate because: Sepsis from pneumonia, recurrent fever         Subjective:   Interval events noted.  He had fever with Tmax of 101.6 overnight.  No new complaints.  No reports of chest pain or shortness of breath.  His wife was at the bedside.  Ardelia Beau, RN, was at the bedside  Objective:    Vitals:   02/25/24 1003 02/25/24 1115 02/25/24 1133 02/25/24 1201  BP: (!) 153/91 (!) 167/89 (!) 141/86 (!) 150/109  Pulse: (!) 108 (!) 111 (!) 107 (!) 104  Resp: 19   19  Temp: 99.3 F (37.4 C)   99.5 F (37.5 C)  TempSrc:      SpO2: 97% 94% 94% 99%  Weight:      Height:       No data found.   Intake/Output Summary (Last 24 hours) at 02/25/2024 1352 Last data filed at 02/25/2024 0301 Gross per 24 hour  Intake 201.38 ml  Output --  Net 201.38 ml    Filed Weights   02/20/24 1641 02/20/24 2100  Weight: 88.5 kg 95.1 kg    Exam:  GEN: NAD SKIN: Warm and dry EYES: No pallor or icterus ENT: MMM CV: RRR PULM: Decreased air entry on right basal hemithorax ABD: soft, ND, NT, +BS CNS: AAO x 3, non focal EXT: No edema or tenderness        Data Reviewed:   I have personally reviewed following labs and imaging studies:  Labs: Labs show the following:   Basic Metabolic Panel: Recent Labs  Lab 02/20/24 1644 02/21/24 0519 02/22/24 0429  02/23/24 0414 02/24/24 0325 02/25/24 0507  NA 131* 133* 128* 131* 132* 131*  K 3.7 4.0 3.5 3.9 3.5 3.3*  CL 95* 101 95* 98 96* 93*  CO2 23 23 22 26 26 26   GLUCOSE 218* 160* 153* 132* 154* 142*  BUN 12 14 17 14 18  21*  CREATININE 1.17 0.94 0.89 0.93 1.06 0.89  CALCIUM 8.9 7.8* 8.1* 8.3* 8.3*  8.1*  MG 1.8  --   --   --   --   --    GFR Estimated Creatinine Clearance: 121.3 mL/min (by C-G formula based on SCr of 0.89 mg/dL). Liver Function Tests: Recent Labs  Lab 02/20/24 1644 02/21/24 0519 02/24/24 0325 02/25/24 0507  AST 30 24 78* 70*  ALT 36 28 55* 58*  ALKPHOS 67 53 110 107  BILITOT 1.4* 1.5* 0.9 1.0  PROT 8.7* 6.8 6.6 6.8  ALBUMIN 4.2 3.1* 2.8* 2.7*   Recent Labs  Lab 02/20/24 1644  LIPASE 31   No results for input(s): "AMMONIA" in the last 168 hours. Coagulation profile No results for input(s): "INR", "PROTIME" in the last 168 hours.  CBC: Recent Labs  Lab 02/21/24 0519 02/22/24 0429 02/23/24 0414 02/24/24 0325 02/25/24 0507  WBC 14.6* 14.5* 16.6* 16.1* 17.3*  NEUTROABS  --  12.9* 13.5* 12.7* 13.0*  HGB 13.9 12.8* 12.3* 12.5* 12.5*  HCT 40.7 37.2* 37.5* 37.7* 36.8*  MCV 84.1 82.9 84.5 84.2 84.0  PLT 181 170 205 251 247   Cardiac Enzymes: No results for input(s): "CKTOTAL", "CKMB", "CKMBINDEX", "TROPONINI" in the last 168 hours. BNP (last 3 results) No results for input(s): "PROBNP" in the last 8760 hours. CBG: Recent Labs  Lab 02/22/24 2114  GLUCAP 143*   D-Dimer: No results for input(s): "DDIMER" in the last 72 hours.  Hgb A1c: No results for input(s): "HGBA1C" in the last 72 hours.  Lipid Profile: No results for input(s): "CHOL", "HDL", "LDLCALC", "TRIG", "CHOLHDL", "LDLDIRECT" in the last 72 hours. Thyroid function studies: No results for input(s): "TSH", "T4TOTAL", "T3FREE", "THYROIDAB" in the last 72 hours.  Invalid input(s): "FREET3"  Anemia work up: No results for input(s): "VITAMINB12", "FOLATE", "FERRITIN", "TIBC", "IRON",  "RETICCTPCT" in the last 72 hours. Sepsis Labs: Recent Labs  Lab 02/20/24 1857 02/20/24 2027 02/21/24 0519 02/22/24 0429 02/23/24 0414 02/24/24 0325 02/25/24 0507  WBC  --   --    < > 14.5* 16.6* 16.1* 17.3*  LATICACIDVEN 2.4* 2.6*  --   --   --   --  1.0   < > = values in this interval not displayed.    Microbiology Recent Results (from the past 240 hours)  Resp panel by RT-PCR (RSV, Flu A&B, Covid) Anterior Nasal Swab     Status: None   Collection Time: 02/20/24  6:57 PM   Specimen: Anterior Nasal Swab  Result Value Ref Range Status   SARS Coronavirus 2 by RT PCR NEGATIVE NEGATIVE Final    Comment: (NOTE) SARS-CoV-2 target nucleic acids are NOT DETECTED.  The SARS-CoV-2 RNA is generally detectable in upper respiratory specimens during the acute phase of infection. The lowest concentration of SARS-CoV-2 viral copies this assay can detect is 138 copies/mL. A negative result does not preclude SARS-Cov-2 infection and should not be used as the sole basis for treatment or other patient management decisions. A negative result may occur with  improper specimen collection/handling, submission of specimen other than nasopharyngeal swab, presence of viral mutation(s) within the areas targeted by this assay, and inadequate number of viral copies(<138 copies/mL). A negative result must be combined with clinical observations, patient history, and epidemiological information. The expected result is Negative.  Fact Sheet for Patients:  BloggerCourse.com  Fact Sheet for Healthcare Providers:  SeriousBroker.it  This test is no t yet approved or cleared by the [place]  FDA and  has been authorized for detection and/or diagnosis of SARS-CoV-2 by FDA under an Emergency Use  Authorization (EUA). This EUA will remain  in effect (meaning this test can be used) for the duration of the COVID-19 declaration under Section 564(b)(1) of the  Act, 21 U.S.C.section 360bbb-3(b)(1), unless the authorization is terminated  or revoked sooner.       Influenza A by PCR NEGATIVE NEGATIVE Final   Influenza B by PCR NEGATIVE NEGATIVE Final    Comment: (NOTE) The Xpert Xpress SARS-CoV-2/FLU/RSV plus assay is intended as an aid in the diagnosis of influenza from Nasopharyngeal swab specimens and should not be used as a sole basis for treatment. Nasal washings and aspirates are unacceptable for Xpert Xpress SARS-CoV-2/FLU/RSV testing.  Fact Sheet for Patients: BloggerCourse.com  Fact Sheet for Healthcare Providers: SeriousBroker.it  This test is not yet approved or cleared by the [place]  FDA and has been authorized for detection and/or diagnosis of SARS-CoV-2 by FDA under an Emergency Use Authorization (EUA). This EUA will remain in effect (meaning this test can be used) for the duration of the COVID-19 declaration under Section 564(b)(1) of the Act, 21 U.S.C. section 360bbb-3(b)(1), unless the authorization is terminated or revoked.     Resp Syncytial Virus by PCR NEGATIVE NEGATIVE Final    Comment: (NOTE) Fact Sheet for Patients: BloggerCourse.com  Fact Sheet for Healthcare Providers: SeriousBroker.it  This test is not yet approved or cleared by the [place]  FDA and has been authorized for detection and/or diagnosis of SARS-CoV-2 by FDA under an Emergency Use Authorization (EUA). This EUA will remain in effect (meaning this test can be used) for the duration of the COVID-19 declaration under Section 564(b)(1) of the Act, 21 U.S.C. section 360bbb-3(b)(1), unless the authorization is terminated or revoked.  Performed at [facility], [address] [place], [place] [address]   Blood culture (routine x 2)     Status: None   Collection Time: 02/20/24  6:57 PM   Specimen: BLOOD  Result Value Ref Range  Status   Specimen Description BLOOD RIGHT FA  Final   Special Requests   Final    BOTTLES DRAWN AEROBIC AND ANAEROBIC Blood Culture results may not be optimal due to an inadequate volume of blood received in culture bottles   Culture   Final    NO GROWTH 5 DAYS Performed at [facility], [address], [place], [place] [address]    Report Status 02/25/2024 FINAL  Final  Blood culture (routine x 2)     Status: None   Collection Time: 02/20/24  6:57 PM   Specimen: BLOOD  Result Value Ref Range Status   Specimen Description BLOOD RIGTH [facility]  Final   Special Requests   Final    BOTTLES DRAWN AEROBIC AND ANAEROBIC Blood Culture results may not be optimal due to an inadequate volume of blood received in culture bottles   Culture   Final    NO GROWTH 5 DAYS Performed at [facility], [address], [place], [place] [address]    Report Status 02/25/2024 FINAL  Final  C Difficile Quick Screen w PCR reflex     Status: None   Collection Time: 02/21/24  6:30 PM   Specimen: STOOL  Result Value Ref Range Status   C Diff antigen NEGATIVE NEGATIVE Final   C Diff toxin NEGATIVE NEGATIVE Final   C Diff interpretation No C. difficile detected.  Final    Comment: Performed at [facility], [address] [place], [place] [address]  MRSA Next Gen by PCR, Nasal  Status: None   Collection Time: 02/22/24  6:15 PM   Specimen: Nasal Mucosa; Nasal Swab  Result Value Ref Range Status   MRSA by PCR Next Gen NOT DETECTED NOT DETECTED Final    Comment: (NOTE) The GeneXpert MRSA Assay (FDA approved for NASAL specimens only), is one component of a comprehensive MRSA colonization surveillance program. It is not intended to diagnose MRSA infection nor to guide or monitor treatment for MRSA infections. Test performance is not FDA approved in patients less than 89 years old. Performed at [facility], [address] [place], [place] [address]   Culture, blood  (Routine X 2) w Reflex to ID Panel     Status: None (Preliminary result)   Collection Time: 02/24/24  5:00 PM   Specimen: BLOOD  Result Value Ref Range Status   Specimen Description BLOOD RIGHT ANTECUBITAL  Final   Special Requests   Final    BOTTLES DRAWN AEROBIC AND ANAEROBIC Blood Culture adequate volume   Culture   Final    NO GROWTH < 24 HOURS Performed at [facility], [address], [place], [place] [address]    Report Status PENDING  Incomplete  Culture, blood (Routine X 2) w Reflex to ID Panel     Status: None (Preliminary result)   Collection Time: 02/24/24  5:15 PM   Specimen: BLOOD  Result Value Ref Range Status   Specimen Description BLOOD BLOOD RIGHT HAND  Final   Special Requests   Final    BOTTLES DRAWN AEROBIC AND ANAEROBIC Blood Culture adequate volume   Culture   Final    NO GROWTH < 24 HOURS Performed at [facility], [address] [place], [place] [address]    Report Status PENDING  Incomplete    Procedures and diagnostic studies:  DG Chest Port 1 View Result Date: 02/25/2024 CLINICAL DATA:  Status post right thoracentesis. EXAM: PORTABLE CHEST 1 VIEW COMPARISON:  02/23/2024.  Chest CT dated 02/24/2024. FINDINGS: Mild decrease in size of a large right pleural effusion with no pneumothorax seen. Clear left lung. Poor inspiration with a grossly normal sized heart. Unremarkable bones. IMPRESSION: Mild decrease in size of a large right pleural effusion with no pneumothorax seen. Electronically Signed   By: Catherin Closs M.D.   On: 02/25/2024 11:49   CT CHEST W CONTRAST Result Date: 02/24/2024 CLINICAL DATA:  Pneumonia, complication suspected, xray done EXAM: CT CHEST WITH CONTRAST TECHNIQUE: Multidetector CT imaging of the chest was performed during intravenous contrast administration. RADIATION DOSE REDUCTION: This exam was performed according to the departmental dose-optimization program which includes automated exposure control, adjustment of  the mA and/or kV according to patient size and/or use of iterative reconstruction technique. CONTRAST:  75mL OMNIPAQUE  IOHEXOL  300 MG/ML  SOLN COMPARISON:  February 23, 2024, February 20, 2024 FINDINGS: Cardiovascular: No cardiomegaly. Trace pericardial effusion. No aortic aneurysm. While the exam was not optimized for the evaluation of the pulmonary arteries, no central pulmonary embolism visualized. Mediastinum/Nodes: No mediastinal mass. Subcentimeter multistation mediastinal lymph nodes measuring up to 8 mm, slightly more prominent than on the prior study. Additionally, a couple of right cardiophrenic lymph nodes are also more prominent measuring 7 mm (axial 83). These are all likely reactive. Lungs/Pleura: The midline trachea and bronchi are patent. Similar right pleural effusion, which is now large in volume, progressed since February 20, 2024. Near complete atelectasis of the right middle and right lower lobes. No pneumothorax. Trace left pleural effusion. Subsegmental atelectasis in the lingula  and anterior basal left lower lobe. Patchy peripheral and subpleural based ground-glass airspace opacities in the anterior left upper lobe (axial 29 and 38), are new. Musculoskeletal: No acute fracture or destructive bone lesion. Small volume symmetric bilateral gynecomastia. Upper Abdomen: Interval development of a large amount of perihepatic fluid along the right hepatic lobe, measuring 15.7 cm in AP dimension and 5.8 cm thick (axial 108). A smaller amount of fluid along the posteromedial right hepatic lobe measures 3.8 cm. IMPRESSION: 1. Large right pleural effusion, significantly increased since February 20, 2024. Multifocal airspace consolidation in the right middle and right lower lobes, likely complete atelectasis. Alternatively, this could represent changes of a worsening multilobar pneumonia. 2. A couple of new peripheral and subpleural patchy ground-glass opacities in the anterior left upper lobe (axial 29, 38). These have the  appearance of either bronchopneumonia or small pulmonary infarcts. In the absence of cavitation, septic emboli are felt to be less likely. While no central pulmonary embolus was visualized, the subsegmental branches of the pulmonary arteries are not well evaluated due to the phase of the contrast bolus. 3. Interval development of a large perihepatic fluid collection along the right hepatic lobe, measuring 15.7 in AP dimension and 5.8 cm thick. A smaller amount of fluid along the posteromedial right hepatic lobe is also present. These are favored to represent subcapsular fluid collections. These results will be called to the ordering clinician or representative by the Radiologist Assistant and communication documented in the PACS or [employer]. Electronically Signed   By: Rance Burrows M.D.   On: 02/24/2024 19:26               LOS: 4 days   Rhylen Shaheen  [facility]   Pager on [email or web address]. If 7PM-7AM, please contact night-coverage at [email or web address]     02/25/2024, 1:52 PM

## 2024-02-25 NOTE — Plan of Care (Signed)
  Problem: Education: Goal: Knowledge of General Education information will improve Description: Including pain rating scale, medication(s)/side effects and non-pharmacologic comfort measures Outcome: Progressing   Problem: Clinical Measurements: Goal: Respiratory complications will improve Outcome: Progressing   Problem: Clinical Measurements: Goal: Cardiovascular complication will be avoided Outcome: Progressing   Problem: Pain Managment: Goal: General experience of comfort will improve and/or be controlled Outcome: Progressing

## 2024-02-25 NOTE — Progress Notes (Signed)
 Patient returned from thoracentesis. Requesting food/drink. Order received from Dr Leory Rands and Dr Jaclynn Mast to resume diet

## 2024-02-25 NOTE — Plan of Care (Signed)
°  Problem: Fluid Volume: °Goal: Hemodynamic stability will improve °Outcome: Progressing °  °Problem: Clinical Measurements: °Goal: Diagnostic test results will improve °Outcome: Progressing °Goal: Signs and symptoms of infection will decrease °Outcome: Progressing °  °

## 2024-02-25 NOTE — Procedures (Signed)
 PROCEDURE SUMMARY:  Successful US  guided right thoracentesis. Yielded 650 ml of amber-colored fluid. Pt tolerated procedure well. No immediate complications.  Specimen sent for labs. CXR ordered; no post-procedure pneumothorax identified.   EBL < 2 mL  Fawn Hooks, NP 02/25/2024 1:48 PM

## 2024-02-25 NOTE — Plan of Care (Signed)
  Problem: Fluid Volume: Goal: Hemodynamic stability will improve Outcome: Progressing   Problem: Respiratory: Goal: Ability to maintain adequate ventilation will improve Outcome: Progressing   Problem: Clinical Measurements: Goal: Ability to maintain clinical measurements within normal limits will improve Outcome: Progressing Goal: Will remain free from infection Outcome: Progressing   Problem: Activity: Goal: Risk for activity intolerance will decrease Outcome: Progressing   Problem: Nutrition: Goal: Adequate nutrition will be maintained Outcome: Progressing   Problem: Coping: Goal: Level of anxiety will decrease Outcome: Progressing   Problem: Elimination: Goal: Will not experience complications related to bowel motility Outcome: Progressing Goal: Will not experience complications related to urinary retention Outcome: Progressing   Problem: Pain Managment: Goal: General experience of comfort will improve and/or be controlled Outcome: Progressing   Problem: Safety: Goal: Ability to remain free from injury will improve Outcome: Progressing   Problem: Skin Integrity: Goal: Risk for impaired skin integrity will decrease Outcome: Progressing

## 2024-02-26 DIAGNOSIS — J189 Pneumonia, unspecified organism: Secondary | ICD-10-CM | POA: Diagnosis not present

## 2024-02-26 DIAGNOSIS — J90 Pleural effusion, not elsewhere classified: Secondary | ICD-10-CM | POA: Diagnosis not present

## 2024-02-26 DIAGNOSIS — R188 Other ascites: Secondary | ICD-10-CM

## 2024-02-26 DIAGNOSIS — A419 Sepsis, unspecified organism: Secondary | ICD-10-CM | POA: Diagnosis not present

## 2024-02-26 LAB — ACID FAST SMEAR (AFB, MYCOBACTERIA): Acid Fast Smear: NEGATIVE

## 2024-02-26 LAB — PATHOLOGIST SMEAR REVIEW

## 2024-02-26 LAB — PROTIME-INR
INR: 1.1 (ref 0.8–1.2)
Prothrombin Time: 14.4 s (ref 11.4–15.2)
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
## 2024-02-27 ENCOUNTER — Inpatient Hospital Stay: Admitting: Radiology

## 2024-02-27 ENCOUNTER — Inpatient Hospital Stay

## 2024-02-27 DIAGNOSIS — J189 Pneumonia, unspecified organism: Secondary | ICD-10-CM | POA: Diagnosis not present

## 2024-02-27 DIAGNOSIS — A419 Sepsis, unspecified organism: Secondary | ICD-10-CM | POA: Diagnosis not present

## 2024-02-27 DIAGNOSIS — R188 Other ascites: Secondary | ICD-10-CM | POA: Diagnosis not present

## 2024-02-27 DIAGNOSIS — J90 Pleural effusion, not elsewhere classified: Secondary | ICD-10-CM | POA: Diagnosis not present

## 2024-02-27 HISTORY — PX: IR PATIENT EVAL TECH 0-60 MINS: IMG5564

## 2024-02-27 LAB — BODY FLUID CULTURE W GRAM STAIN

## 2024-02-27 LAB — CBC
HCT: 39.2 % (ref 39.0–52.0)
Hemoglobin: 13.1 g/dL (ref 13.0–17.0)
MCH: 27.9 pg (ref 26.0–34.0)
MCHC: 33.4 g/dL (ref 30.0–36.0)
MCV: 83.6 fL (ref 80.0–100.0)
Platelets: 318 10*3/uL (ref 150–400)
RBC: 4.69 MIL/uL (ref 4.22–5.81)
RDW: 13 % (ref 11.5–15.5)
WBC: 16.4 10*3/uL — ABNORMAL HIGH (ref 4.0–10.5)
nRBC: 0 % (ref 0.0–0.2)

## 2024-02-27 LAB — CREATININE, SERUM
Creatinine, Ser: 0.78 mg/dL (ref 0.61–1.24)
GFR, Estimated: >60 mL/min (ref >60)

## 2024-02-27 MED ORDER — LIDOCAINE HCL 1 % IJ SOLN
INTRAMUSCULAR | Status: AC
  Filled 2024-02-27: qty 20

## 2024-02-27 MED ORDER — FENTANYL CITRATE (PF) 100 MCG/2ML IJ SOLN
INTRAMUSCULAR | Status: AC | PRN
  Administered 2024-02-27 (×2): 25 ug via INTRAVENOUS
  Administered 2024-02-27: 50 ug via INTRAVENOUS

## 2024-02-27 MED ORDER — MIDAZOLAM HCL 2 MG/2ML IJ SOLN
INTRAMUSCULAR | Status: AC | PRN
  Administered 2024-02-27 (×2): .5 mg via INTRAVENOUS
  Administered 2024-02-27: 1 mg via INTRAVENOUS

## 2024-02-27 MED ORDER — SODIUM CHLORIDE 0.9% FLUSH
10.0000 mL | Freq: Three times a day (TID) | INTRAVENOUS | Status: DC
  Administered 2024-02-27 – 2024-03-01 (×8): 10 mL via INTRAPLEURAL

## 2024-02-27 MED ORDER — FENTANYL CITRATE (PF) 100 MCG/2ML IJ SOLN
INTRAMUSCULAR | Status: AC
  Filled 2024-02-27: qty 2

## 2024-02-27 MED ORDER — MIDAZOLAM HCL 2 MG/2ML IJ SOLN
INTRAMUSCULAR | Status: AC
Start: 2024-02-27 — End: 2024-02-27
  Filled 2024-02-27: qty 2

## 2024-02-27 NOTE — Progress Notes (Signed)
 Patient clinically stable post Ct 12 FR chest tube placement per Dr Nereida Banning with 650 ml serous yellow output, thereafter 10 Fr hepatic drain placed mid right abdomen with 375 ml tan purulent output. To JP drain bulb. Tolerated well. Received Versed 2 mg along with Fentanyl 100 mcg IV for procedure. Family brought to bedside post procedure/recovery/specials with update given . Report called to [facility] RN/2C along with bedside update. Taking po's without difficulty.
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
## 2024-02-27 NOTE — Procedures (Signed)
 Interventional Radiology Procedure Note  Procedure: CT guided right thoracostomy tube placement; CT Guided Drainage of right perihepatic abscess  Complications: None  Estimated Blood Loss: < 10 mL  Findings: 14 Fr drain placed in right pleural space with return of yellow fluid. Pigtail drain attached to Pleur-evac at -20 cm water wall suction.  10 Fr drain placed in perihepatic fluid collection with return of turbid fluid. Fluid sample sent for culture analysis. Drain attached to suction bulb drainage.  Will follow.  Nonda Bays. Nereida Banning, M.D Pager:  [phone]

## 2024-02-27 NOTE — Progress Notes (Signed)
 [facility]  - [place] at [facility]   PATIENT NAME: Andrew Khan    MR#:  [id number]  DATE OF BIRTH:  03-Jul-1976  SUBJECTIVE:  No family at bedside. Patient sitting out in the chair. Denies any complaints. Pt to get Chest tube and perihepatic drain today    VITALS:  Blood pressure (!) 150/89, pulse (!) 106, temperature 98.5 F (36.9 C), temperature source Oral, resp. rate 18, height 5' 10 (1.778 m), weight 95.1 kg, SpO2 96%.  PHYSICAL EXAMINATION:   GENERAL:  48 y.o.-year-old patient with no acute distress.  LUNGS: decreased breath sounds bilaterally right more than left, no wheezing CARDIOVASCULAR: S1, S2 normal. No murmur   ABDOMEN: Soft, nontender, nondistended. Bowel sounds present.  EXTREMITIES: No  edema b/l.    NEUROLOGIC: nonfocal  patient is alert and awake  LABORATORY PANEL:  CBC Recent Labs  Lab 02/27/24 0459  WBC 16.4*  HGB 13.1  HCT 39.2  PLT 318    Chemistries  Recent Labs  Lab 02/20/24 1644 02/21/24 0519 02/25/24 0507 02/27/24 0459  NA 131*   < > 131*  --   K 3.7   < > 3.3*  --   CL 95*   < > 93*  --   CO2 23   < > 26  --   GLUCOSE 218*   < > 142*  --   BUN 12   < > 21*  --   CREATININE 1.17   < > 0.89 0.78  CALCIUM 8.9   < > 8.1*  --   MG 1.8  --   --   --   AST 30   < > 70*  --   ALT 36   < > 58*  --   ALKPHOS 67   < > 107  --   BILITOT 1.4*   < > 1.0  --    < > = values in this interval not displayed.    Assessment and Plan  BRODAN GREWELL is a 48 y.o. male with no significant past medical history presented to the hospital with right-sided lower chest pain, 2 weeks duration and acute onset of shortness of breath that started on the day of admission.   He said he had injury to the right side of his rib and saw a chiropractor for manipulation about a week prior to admission.   In the ED, he was diaphoretic, tachypneic and tachycardic.  He subsequently became febrile and hypoxic with oxygen saturation of 88% on  room air.  Severe sepsis secondary to community-acquired pneumonia --pt presented with recurrent fever, leukocytosis, large right pleural effusion  --6/10--S/p right thoracentesis with removal of 650 mL of amber fluid on  --  Pleural fluid is exudative likely from parapneumonic effusion.   -- pleural fluid culture so far neg.  --AFB Pleural fluid negative --Continue IV Zosyn per ID Dr Harwood Lingo --Follow up with ID and pulmonologist. ---[facility] negative--No growth --MRSA screen was negative. --Strep pneumo and Legionella urine antigen tests were negative.   --Lactic acid was 2.4, 2.6. ---WBC down from 21.5---14.6--16.6---16.1---17.3 --6/11--IR consulted for persistent large right side of pleural effusion and Perry hepatic fluid.IR dr Marne Sings recommends CT guide chest tube and Perihepatic drain tomorrow --6/12--no complaints. IR to do procedure today   Right-sided pleuritic chest pain: Likely due to pneumonia --No evidence of pulmonary embolism on CT chest.  Elevated liver enzymes, large perihepatic fluid collection along the right hepatic lobe: Acute hepatitis panel was negative. --  Etiology of hepatic fluid collection is not clear.  Discussed with Dr. Aleskerov.  --May be related to acute right pleural effusion/pneumonia.   --Mild hyperbilirubinemia: Resolved.  Probably from sepsis.  Liver ultrasound showed steatosis.  Acute hypoxic respiratory failure: Improved.      Hypertensive urgency: BP is better.  Hold losartan for now.     Hyperglycemia, prediabetes: Glucose levels are better.  A1c 5.7 suggestive of prediabetes   Hyponatremia: Improved from 128-131.    Hypokalemia: Replete potassium and monitor levels    Acute diarrheal illness: Likely from antibiotics.  Stool for C. difficile toxin was negative.  Improved.      Procedures: right-sided thoracentesis Family communication :none today Consults : pulmonary, ID, IR CODE STATUS: full DVT Prophylaxis : Lovenox  Level of  care: Med-Surg Status is: Inpatient Remains inpatient appropriate because: persistently will write pleural effusion and Perry hepatic fluid. Patient will need CT guided drain placement and chest tube placement tomorrow    TOTAL TIME TAKING CARE OF THIS PATIENT: 35 minutes.  >50% time spent on counselling and coordination of care  Note: This dictation was prepared with Dragon dictation along with smaller phrase technology. Any transcriptional errors that result from this process are unintentional.  Melvinia Stager M.D    [facility]   CC: Primary care physician; Patient, No Pcp Per

## 2024-02-28 DIAGNOSIS — A419 Sepsis, unspecified organism: Secondary | ICD-10-CM | POA: Diagnosis not present

## 2024-02-28 DIAGNOSIS — R188 Other ascites: Secondary | ICD-10-CM | POA: Diagnosis not present

## 2024-02-28 DIAGNOSIS — J90 Pleural effusion, not elsewhere classified: Secondary | ICD-10-CM | POA: Diagnosis not present

## 2024-02-28 DIAGNOSIS — J189 Pneumonia, unspecified organism: Secondary | ICD-10-CM | POA: Diagnosis not present

## 2024-02-28 MED ORDER — ENSURE PLUS HIGH PROTEIN PO LIQD
237.0000 mL | Freq: Three times a day (TID) | ORAL | Status: DC
  Administered 2024-02-28 – 2024-03-03 (×11): 237 mL via ORAL

## 2024-02-28 MED ORDER — ADULT MULTIVITAMIN W/MINERALS CH
1.0000 | ORAL_TABLET | Freq: Every day | ORAL | Status: DC
  Administered 2024-02-29 – 2024-03-03 (×4): 1 via ORAL
  Filled 2024-02-28 (×4): qty 1

## 2024-02-28 NOTE — Plan of Care (Signed)
 Id brief note  Perihepatic fluid/abscess Pleural effusion s/p tube thoracostomy; pulm following plan lytic therapy  Gpc on perihepatic fluid     -suspect strep milleri group; mssa possible -continue piptazo pending final culture -add echo endocarditis -discussed with team

## 2024-02-28 NOTE — Progress Notes (Signed)
 [facility]  - [place] at [facility]   PATIENT NAME: Andrew Khan    MR#:  [id number]  DATE OF BIRTH:  March 26, 1976  SUBJECTIVE:  wife at bedside. Patient sitting  in the bed  S/p Chest tube and perihepatic drain placement on the right ambulating in the room by himself. No fever.  Chest tube drainage more than 1300 mL. Mucosa pleural and drainage  Peri hepatic drain    VITALS:  Blood pressure (!) 144/88, pulse 93, temperature 97.8 F (36.6 C), temperature source Oral, resp. rate 18, height 5' 10 (1.778 m), weight 95 kg, SpO2 94%.  PHYSICAL EXAMINATION:   GENERAL:  48 y.o.-year-old patient with no acute distress.  LUNGS: decreased breath sounds bilaterally right more than left Right CT+ CARDIOVASCULAR: S1, S2 normal. No murmur   ABDOMEN: Soft, nontender, nondistended. Right flank drain+ EXTREMITIES: No  edema b/l.    NEUROLOGIC: nonfocal  patient is alert and awake  LABORATORY PANEL:  CBC Recent Labs  Lab 02/27/24 0459  WBC 16.4*  HGB 13.1  HCT 39.2  PLT 318    Chemistries  Recent Labs  Lab 02/25/24 0507 02/27/24 0459  NA 131*  --   K 3.3*  --   CL 93*  --   CO2 26  --   GLUCOSE 142*  --   BUN 21*  --   CREATININE 0.89 0.78  CALCIUM 8.1*  --   AST 70*  --   ALT 58*  --   ALKPHOS 107  --   BILITOT 1.0  --     Assessment and Plan  Andrew Khan is a 48 y.o. male with no significant past medical history presented to the hospital with right-sided lower chest pain, 2 weeks duration and acute onset of shortness of breath that started on the day of admission.   He said he had injury to the right side of his rib and saw a chiropractor for manipulation about a week prior to admission.   In the ED, he was diaphoretic, tachypneic and tachycardic.  He subsequently became febrile and hypoxic with oxygen saturation of 88% on room air.  Severe sepsis secondary to community-acquired pneumonia --pt presented with recurrent fever, leukocytosis,  large right pleural effusion  --6/10--S/p right thoracentesis with removal of 650 mL of amber fluid on  --  Pleural fluid is exudative likely from parapneumonic effusion.   -- pleural fluid culture so far neg.  --AFB Pleural fluid negative --Continue IV Zosyn  per ID Dr Harwood Lingo --Follow up with ID and pulmonologist. ---[facility] negative--No growth --MRSA screen was negative. --Strep pneumo and Legionella urine antigen tests were negative.   --Lactic acid was 2.4, 2.6. ---WBC down from 21.5---14.6--16.6---16.1---17.3 --6/11--IR consulted for persistent large right side of pleural effusion and Perry hepatic fluid.IR dr Marne Sings recommends CT guide chest tube and Perihepatic drain tomorrow --6/12--no complaints. IR to do procedure today --6/13-->1300 cc fluid in CT today. Mucopurulent fluid in perihepatic drain. Pulm plans for TPA once fluid out put decreases.  --Hepatic fluid--Rare GPC. --d/w ID dr Vu--cont zosyn , get echo    Right-sided pleuritic chest pain: Likely due to pneumonia --No evidence of pulmonary embolism on CT chest.  Elevated liver enzymes, large perihepatic fluid collection along the right hepatic lobe: Acute hepatitis panel was negative. --  Etiology of hepatic fluid collection is not clear.  Discussed with Dr. Aleskerov.  --May be related to acute right pleural effusion/pneumonia.   --Mild hyperbilirubinemia: Resolved.  Probably from sepsis.  Liver ultrasound  showed steatosis.  Acute hypoxic respiratory failure: Improved.      Hypertensive urgency: BP is better.  Hold losartan  for now.     Hyperglycemia, prediabetes: Glucose levels are better.  A1c 5.7 suggestive of prediabetes   Hyponatremia: Improved from 128-131.    Hypokalemia: Replete potassium and monitor levels    Acute diarrheal illness: Likely from antibiotics.  Stool for C. difficile toxin was negative.  Improved.      Procedures: right-sided thoracentesis Family communication :wife Consults :  pulmonary, ID, IR CODE STATUS: full DVT Prophylaxis : Lovenox  Level of care: Med-Surg Status is: Inpatient Remains inpatient appropriate because: pt will be monitored over the weekend. Will need TPA thru CT once fluid output decreases    TOTAL TIME TAKING CARE OF THIS PATIENT: 35 minutes.  >50% time spent on counselling and coordination of care  Note: This dictation was prepared with Dragon dictation along with smaller phrase technology. Any transcriptional errors that result from this process are unintentional.  Melvinia Stager M.D    [facility]   CC: Primary care physician; Patient, No Pcp Per

## 2024-02-28 NOTE — Progress Notes (Signed)
 Initial Nutrition Assessment  DOCUMENTATION CODES:   Not applicable  INTERVENTION:   Ensure Plus High Protein po TID, each supplement provides 350 kcal and 20 grams of protein.  MVI po daily   Pt at refeed risk; recommend monitor potassium, magnesium and phosphorus labs daily until stable  Daily weights   NUTRITION DIAGNOSIS:   Increased nutrient needs related to acute illness as evidenced by estimated needs  GOAL:   Patient will meet greater than or equal to 90% of their needs  MONITOR:   PO intake, Supplement acceptance, Labs, Weight trends, I & O's, Skin  REASON FOR ASSESSMENT:   Malnutrition Screening Tool    ASSESSMENT:   48 y/o male with h/o HTN and rib injury who is admitted with diarrhea, sepsis, elevated LFTs and CAP with large right pleural effusion s/p chest tube 6/12.  Pt s/p CT guided right thoracostomy tube placement; CT Guided Drainage of right perihepatic abscess 6/12  Met with pt and pt's wife in room today. Pt reports good appetite and oral intake at baseline and in hospital. Pt has mainly been eating food brought from outside of the hospital. Pt has been drinking all of the Ensure (prefers chocolate). RD will increase Ensure to three times daily to help him meet his estimated needs. Will check refeed labs tomorrow. Per chart, pt appears weight stable since admission. There is no weight history in chart to determine if any significant recent changes.   Medications reviewed and include: lovenox , zosyn    Labs reviewed: Na 131(L), K 3.3(L), BUN 21(H) Wbc- 16.4(H)  Chest tube-   Drains   NUTRITION - FOCUSED PHYSICAL EXAM:  Flowsheet Row Most Recent Value  Orbital Region No depletion  Upper Arm Region No depletion  Thoracic and Lumbar Region No depletion  Buccal Region No depletion  Temple Region No depletion  Clavicle Bone Region No depletion  Clavicle and Acromion Bone Region No depletion  Scapular Bone Region No depletion   Dorsal Hand No depletion  Patellar Region No depletion  Anterior Thigh Region No depletion  Posterior Calf Region No depletion  Edema (RD Assessment) Mild  Hair Reviewed  Eyes Reviewed  Mouth Reviewed  Skin Reviewed  Nails Reviewed   Diet Order:   Diet Order             Diet regular Room service appropriate? Yes; Fluid consistency: Thin  Diet effective now                  EDUCATION NEEDS:   Education needs have been addressed  Skin:  Skin Assessment: Reviewed RN Assessment  Last BM:  6/12  Height:   Ht Readings from Last 1 Encounters:  02/27/24 5' 10 (1.778 m)    Weight:   Wt Readings from Last 1 Encounters:  02/28/24 95.1 kg    Ideal Body Weight:  75.45 kg  BMI:  Body mass index is 30.07 kg/m.  Estimated Nutritional Needs:   Kcal:  2200-2500kcal/day  Protein:  110-125g/day  Fluid:  2.2-2.5L/day  Torrance Freestone MS, RD, LDN If unable to be reached, please send secure chat to RD inpatient available from 8:00a-4:00p daily
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
## 2024-02-28 NOTE — Plan of Care (Signed)
  Problem: Fluid Volume: Goal: Hemodynamic stability will improve Outcome: Progressing   Problem: Respiratory: Goal: Ability to maintain adequate ventilation will improve Outcome: Progressing   Problem: Clinical Measurements: Goal: Ability to maintain clinical measurements within normal limits will improve Outcome: Progressing Goal: Will remain free from infection Outcome: Progressing Goal: Diagnostic test results will improve Outcome: Progressing Goal: Respiratory complications will improve Outcome: Progressing Goal: Cardiovascular complication will be avoided Outcome: Progressing
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
## 2024-02-29 ENCOUNTER — Inpatient Hospital Stay

## 2024-02-29 ENCOUNTER — Inpatient Hospital Stay
Admit: 2024-02-29 | Discharge: 2024-02-29 | Disposition: A | Discharge status: Home / Self Care | Attending: Internal Medicine | Admitting: Internal Medicine

## 2024-02-29 DIAGNOSIS — K650 Generalized (acute) peritonitis: Secondary | ICD-10-CM

## 2024-02-29 DIAGNOSIS — J90 Pleural effusion, not elsewhere classified: Secondary | ICD-10-CM | POA: Diagnosis not present

## 2024-02-29 DIAGNOSIS — K750 Abscess of liver: Secondary | ICD-10-CM

## 2024-02-29 DIAGNOSIS — J189 Pneumonia, unspecified organism: Secondary | ICD-10-CM | POA: Diagnosis not present

## 2024-02-29 DIAGNOSIS — R188 Other ascites: Secondary | ICD-10-CM | POA: Diagnosis not present

## 2024-02-29 DIAGNOSIS — A419 Sepsis, unspecified organism: Secondary | ICD-10-CM | POA: Diagnosis not present

## 2024-02-29 LAB — BASIC METABOLIC PANEL WITH GFR
Anion gap: 10 (ref 5–15)
BUN: 13 mg/dL (ref 6–20)
CO2: 23 mmol/L (ref 22–32)
Calcium: 8 mg/dL — ABNORMAL LOW (ref 8.9–10.3)
Chloride: 100 mmol/L (ref 98–111)
Creatinine, Ser: 0.68 mg/dL (ref 0.61–1.24)
GFR, Estimated: >60 mL/min (ref >60)
Glucose, Bld: 152 mg/dL — ABNORMAL HIGH (ref 70–99)
Potassium: 4 mmol/L (ref 3.5–5.1)
Sodium: 133 mmol/L — ABNORMAL LOW (ref 135–145)

## 2024-02-29 LAB — ECHOCARDIOGRAM COMPLETE
AR max vel: 3.28 cm2
AV Peak grad: 6.2 mmHg
Ao pk vel: 1.24 m/s
Area-P 1/2: 5.13 cm2
Height: 70 in
S' Lateral: 3.2 cm
Weight: 3280.44 [oz_av]

## 2024-02-29 LAB — CULTURE, BLOOD (ROUTINE X 2)
Culture: NO GROWTH
Culture: NO GROWTH
Special Requests: ADEQUATE
Special Requests: ADEQUATE

## 2024-02-29 LAB — PHOSPHORUS: Phosphorus: 3.7 mg/dL (ref 2.5–4.6)

## 2024-02-29 LAB — MAGNESIUM: Magnesium: 2.3 mg/dL (ref 1.7–2.4)

## 2024-02-29 MED ORDER — STERILE WATER FOR INJECTION IJ SOLN
5.0000 mg | Freq: Once | RESPIRATORY_TRACT | Status: AC
  Administered 2024-02-29: 5 mg via INTRAPLEURAL
  Filled 2024-02-29: qty 5

## 2024-02-29 MED ORDER — IOHEXOL 300 MG/ML  SOLN
100.0000 mL | Freq: Once | INTRAMUSCULAR | Status: AC | PRN
  Administered 2024-02-29: 100 mL via INTRAVENOUS

## 2024-02-29 MED ORDER — SODIUM CHLORIDE 0.9% FLUSH
10.0000 mL | Freq: Three times a day (TID) | INTRAVENOUS | Status: DC
  Administered 2024-02-29: 10 mL via INTRAPLEURAL

## 2024-02-29 MED ORDER — METOPROLOL SUCCINATE ER 25 MG PO TB24
25.0000 mg | ORAL_TABLET | Freq: Every day | ORAL | Status: DC
  Administered 2024-02-29 – 2024-03-03 (×4): 25 mg via ORAL
  Filled 2024-02-29 (×4): qty 1

## 2024-02-29 MED ORDER — SODIUM CHLORIDE (PF) 0.9 % IJ SOLN
10.0000 mg | Freq: Once | INTRAMUSCULAR | Status: AC
  Administered 2024-02-29: 10 mg via INTRAPLEURAL
  Filled 2024-02-29: qty 10

## 2024-02-29 NOTE — Procedures (Signed)
 Pleural Fibrinolytic Administration Procedure Note       Provider Performing: Erskin Hearing MD   Procedure: Pleural Fibrinolysis Initial day (16109)   Indication(s) Fibrinolysis of complicated pleural effusion   Consent Risks of the procedure as well as the alternatives and risks of each were explained to the patient and/or caregiver.  Consent for the procedure was obtained.   Anesthesia None   Time Out Verified patient identification, verified procedure, site/side was marked, verified correct patient position, special equipment/implants available, medications/allergies/relevant history reviewed, required imaging and test results available.   Sterile Technique Hand hygiene, gloves   Procedure Description Existing pleural catheter was cleaned and accessed in sterile manner.  10mg  of tPA and 5mg  Dornase in 30cc of saline were injected into pleural space using existing pleural catheter.  Catheter will be clamped for 1 hour and then placed back to suction.   - continue tPA 10mg  + DNase 5mg  BID x3 total doses  STEPWISE INSTRUCTIONS:     1) turn chest tube stopcock OFF to external atrium and sterilize flush port with alcohol swab     2) flush tPA 10mg  toward patient, then DNase 5mg  toward patient, then saline 10mg  toward patient     3) turn chest tube stopcock OFF to patient (to allow tPA + DNase to dwell in pleural space) x1hr     4) after 1hr, turn stopcock OFF to flush port, allowing drainage from patient into external atrium     5) repeat steps 1-4 twice daily for 3 total doses (continue usual chest tube flushes q8hr) - please monitor chest tube output; inform IP if quantity and/or quality changes - obtain daily AM CXR PA+Lat to assess interval change in effusion size - obtain CT chest non-contrast after 6th dose of tPA + DNase in order to evaluate 1) success of therapy and 2) underlying lung parenchyma for evidence of disease (eg suspect lesions) - maintain active type and screen  while TPA-DNase is being placed   Complications/Tolerance None; patient tolerated the procedure well.   EBL None   Specimen(s) None     Erskin Hearing, M.D.  Pulmonary & Critical Care Medicine  [facility]

## 2024-02-29 NOTE — Progress Notes (Signed)
 MEWS Progress Note  Patient Details Name: Andrew Khan MRN: [id number] DOB: 1976/04/02 Today's Date: 02/29/2024   MEWS Flowsheet Documentation:  Assess: MEWS Score Temp: 98.9 F (37.2 C) BP: (!) 143/96 MAP (mmHg): 109 Pulse Rate: (!) 115 ECG Heart Rate: (!) 114 Resp: 16 Level of Consciousness: Alert SpO2: 97 % O2 Device: Room Air O2 Flow Rate (L/min): 2 L/min Assess: MEWS Score MEWS Temp: 0 MEWS Systolic: 0 MEWS Pulse: 2 MEWS RR: 0 MEWS LOC: 0 MEWS Score: 2 MEWS Score Color: Yellow Assess: SIRS CRITERIA SIRS Temperature : 0 SIRS Respirations : 0 SIRS Pulse: 1 SIRS WBC: 0 SIRS Score Sum : 1 Assess: if the MEWS score is Yellow or Red Were vital signs accurate and taken at a resting state?: Yes Does the patient meet 2 or more of the SIRS criteria?: Yes Does the patient have a confirmed or suspected source of infection?: Yes MEWS guidelines implemented : Yes, yellow Treat MEWS Interventions: Considered administering scheduled or prn medications/treatments as ordered Take Vital Signs Increase Vital Sign Frequency : Yellow: Q2hr x1, continue Q4hrs until patient remains green for 12hrs Escalate MEWS: Escalate: Yellow: Discuss with charge nurse and consider notifying provider and/or RRT Notify: Charge Nurse/RN Name of Charge Nurse/RN Notified: Ira Mann, RN  Dr Lydia Sams aware of increased HR, Lopressor ordered and given.     Cherylynn Cosier 02/29/2024, 4:57 PM

## 2024-02-29 NOTE — Progress Notes (Signed)
  Echocardiogram 2D Echocardiogram has been performed.  Andrew Khan 02/29/2024, 4:03 PM

## 2024-02-29 NOTE — Plan of Care (Signed)
  Problem: Fluid Volume: Goal: Hemodynamic stability will improve Outcome: Progressing   Problem: Clinical Measurements: Goal: Signs and symptoms of infection will decrease Outcome: Progressing   Problem: Respiratory: Goal: Ability to maintain adequate ventilation will improve Outcome: Progressing   Problem: Education: Goal: Knowledge of General Education information will improve Description: Including pain rating scale, medication(s)/side effects and non-pharmacologic comfort measures Outcome: Progressing   Problem: Health Behavior/Discharge Planning: Goal: Ability to manage health-related needs will improve Outcome: Progressing   Problem: Clinical Measurements: Goal: Ability to maintain clinical measurements within normal limits will improve Outcome: Progressing Goal: Respiratory complications will improve Outcome: Progressing Goal: Cardiovascular complication will be avoided Outcome: Progressing   Problem: Activity: Goal: Risk for activity intolerance will decrease Outcome: Progressing   Problem: Nutrition: Goal: Adequate nutrition will be maintained Outcome: Progressing   Problem: Elimination: Goal: Will not experience complications related to urinary retention Outcome: Progressing   Problem: Pain Managment: Goal: General experience of comfort will improve and/or be controlled Outcome: Progressing
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
## 2024-02-29 NOTE — Progress Notes (Signed)
 [facility]  - [facility]   PATIENT NAME: Andrew Khan    MR#:  [id number]  DATE OF BIRTH:  Mar 20, 1976  SUBJECTIVE:  wife at bedside. Patient sitting  in the bed  S/p Chest tube and perihepatic drain placement on the right ambulating in the room by himself. No fever.  Chest tube drainage 80 cc /24 hr. Perihepatic drainage decreasing   VITALS:  Blood pressure (!) 144/88, pulse (!) 103, temperature 98.6 F (37 C), resp. rate 16, height 5' 10 (1.778 m), weight 93 kg, SpO2 93%.  PHYSICAL EXAMINATION:   GENERAL:  48 y.o.-year-old patient with no acute distress.  LUNGS: decreased breath sounds bilaterally right more than left Right CT+ CARDIOVASCULAR: S1, S2 normal. No murmur   ABDOMEN: Soft, nontender, nondistended. Right flank drain+ EXTREMITIES: No  edema b/l.    NEUROLOGIC: nonfocal  patient is alert and awake  LABORATORY PANEL:  CBC Recent Labs  Lab 02/27/24 0459  WBC 16.4*  HGB 13.1  HCT 39.2  PLT 318    Chemistries  Recent Labs  Lab 02/25/24 0507 02/27/24 0459 02/29/24 0422  NA 131*  --  133*  K 3.3*  --  4.0  CL 93*  --  100  CO2 26  --  23  GLUCOSE 142*  --  152*  BUN 21*  --  13  CREATININE 0.89   < > 0.68  CALCIUM 8.1*  --  8.0*  MG  --   --  2.3  AST 70*  --   --   ALT 58*  --   --   ALKPHOS 107  --   --   BILITOT 1.0  --   --    < > = values in this interval not displayed.    Assessment and Plan  Andrew Khan is a 48 y.o. male with no significant past medical history presented to the hospital with right-sided lower chest pain, 2 weeks duration and acute onset of shortness of breath that started on the day of admission.   He said he had injury to the right side of his rib and saw a chiropractor for manipulation about a week prior to admission.   In the ED, he was diaphoretic, tachypneic and tachycardic.  He subsequently became febrile and hypoxic with oxygen saturation of 88% on room air.  Severe sepsis  secondary to community-acquired pneumonia Large Parapneumonic effusion --pt presented with recurrent fever, leukocytosis, large right pleural effusion  --6/10--S/p right thoracentesis with removal of 650 mL of amber fluid on  --  Pleural fluid is exudative likely from parapneumonic effusion.   -- pleural fluid culture so far neg.  --AFB Pleural fluid negative --Continue IV Zosyn  per ID Dr Harwood Lingo --Follow up with ID and pulmonologist. ---[facility] negative--No growth --MRSA screen was negative. --Strep pneumo and Legionella urine antigen tests were negative.   --Lactic acid was 2.4, 2.6. ---WBC down from 21.5---14.6--16.6---16.1---17.3 --6/11--IR consulted for persistent large right side of pleural effusion and [place] hepatic fluid.IR dr Marne Sings recommends CT guide chest tube and Perihepatic drain tomorrow --6/12--no complaints. IR to do procedure today --6/13-->1300 cc fluid in CT today. Mucopurulent fluid in perihepatic drain. Pulm plans for TPA once fluid out put decreases.  --Hepatic fluid--Rare GPC. --d/w ID dr Vu--cont zosyn , get echo  --6/14--Surgery consult with dr pabon--recommends repeat CT abd to check for undrained pockets. TPA x1 given by Dr A   Elevated liver enzymes/?Perihepatic abscess  large perihepatic fluid collection along the  right hepatic lobe: Acute hepatitis panel was negative. --  Etiology of hepatic fluid collection is not clear.  Discussed with Dr. Aleskerov.  --May be related to acute right pleural effusion/pneumonia.   --Mild hyperbilirubinemia: Resolved.  Probably from sepsis.  Liver ultrasound showed steatosis.  Acute hypoxic respiratory failure: Improved.      HTN --start low dose Torpol XL  Hyperglycemia, prediabetes: Glucose levels are better.  A1c 5.7 suggestive of prediabetes   Hyponatremia: Improved from 128-133   Hypokalemia: Repleted      Procedures: right-sided thoracentesis, right Chest tube, right perihepatic drain Family communication  :wife Consults : pulmonary, ID, IR, gen surgery CODE STATUS: full DVT Prophylaxis : Lovenox  Level of care: Med-Surg Status is: Inpatient Remains inpatient appropriate because: pt will be monitored over the weekend. Will need TPA thru CT once fluid output decreases    TOTAL TIME TAKING CARE OF THIS PATIENT: 35 minutes.  >50% time spent on counselling and coordination of care  Note: This dictation was prepared with Dragon dictation along with smaller phrase technology. Any transcriptional errors that result from this process are unintentional.  Melvinia Stager M.D    [facility]   CC: Primary care physician; Patient, No Pcp Per
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
## 2024-03-01 ENCOUNTER — Inpatient Hospital Stay

## 2024-03-01 DIAGNOSIS — K650 Generalized (acute) peritonitis: Secondary | ICD-10-CM | POA: Diagnosis not present

## 2024-03-01 DIAGNOSIS — A419 Sepsis, unspecified organism: Secondary | ICD-10-CM | POA: Diagnosis not present

## 2024-03-01 LAB — CBC
HCT: 42 % (ref 39.0–52.0)
Hemoglobin: 13.9 g/dL (ref 13.0–17.0)
MCH: 27.8 pg (ref 26.0–34.0)
MCHC: 33.1 g/dL (ref 30.0–36.0)
MCV: 84 fL (ref 80.0–100.0)
Platelets: 473 10*3/uL — ABNORMAL HIGH (ref 150–400)
RBC: 5 MIL/uL (ref 4.22–5.81)
RDW: 12.6 % (ref 11.5–15.5)
WBC: 12.7 10*3/uL — ABNORMAL HIGH (ref 4.0–10.5)
nRBC: 0 % (ref 0.0–0.2)

## 2024-03-01 LAB — HEMOGLOBIN A1C
Hgb A1c MFr Bld: 6.3 % — ABNORMAL HIGH (ref 4.8–5.6)
Mean Plasma Glucose: 134.11 mg/dL

## 2024-03-01 LAB — AEROBIC/ANAEROBIC CULTURE W GRAM STAIN (SURGICAL/DEEP WOUND): Culture: NO GROWTH

## 2024-03-01 MED ORDER — SODIUM CHLORIDE (PF) 0.9 % IJ SOLN
10.0000 mg | Freq: Once | INTRAMUSCULAR | Status: AC
  Administered 2024-03-01: 10 mg via INTRAPLEURAL
  Filled 2024-03-01: qty 10

## 2024-03-01 MED ORDER — SODIUM CHLORIDE 0.9% FLUSH
10.0000 mL | Freq: Three times a day (TID) | INTRAVENOUS | Status: DC
  Administered 2024-03-01 – 2024-03-03 (×5): 10 mL via INTRAPLEURAL

## 2024-03-01 MED ORDER — LIDOCAINE 5 % EX PTCH: 1.0000 | MEDICATED_PATCH | Freq: Every day | CUTANEOUS | Status: DC | PRN

## 2024-03-01 MED ORDER — STERILE WATER FOR INJECTION IJ SOLN
5.0000 mg | Freq: Once | RESPIRATORY_TRACT | Status: AC
  Administered 2024-03-01: 5 mg via INTRAPLEURAL
  Filled 2024-03-01: qty 5

## 2024-03-01 NOTE — Progress Notes (Signed)
 [facility]  - [place] at [facility]   PATIENT NAME: Andrew Khan    MR#:  [id number]  DATE OF BIRTH:  1976-02-06  SUBJECTIVE:  No family at bedside. Patient sitting  in the bed  S/p Chest tube and perihepatic drain placement on the right ambulating in the room by himself. No fever.  Chest tube drainage 214 cc so far for today. Perihepatic drainage decreasing <20 cc   VITALS:  Blood pressure 128/84, pulse (!) 111, temperature 97.7 F (36.5 C), temperature source Oral, resp. rate 20, height 5' 10 (1.778 m), weight 92.7 kg, SpO2 94%.  PHYSICAL EXAMINATION:   GENERAL:  48 y.o.-year-old patient with no acute distress.  LUNGS: decreased breath sounds bilaterally right more than left Right CT+ CARDIOVASCULAR: S1, S2 normal. No murmur   ABDOMEN: Soft, nontender, nondistended. Right flank drain+ EXTREMITIES: No  edema b/l.    NEUROLOGIC: nonfocal  patient is alert and awake  LABORATORY PANEL:  CBC Recent Labs  Lab 03/01/24 0627  WBC 12.7*  HGB 13.9  HCT 42.0  PLT 473*    Chemistries  Recent Labs  Lab 02/25/24 0507 02/27/24 0459 02/29/24 0422  NA 131*  --  133*  K 3.3*  --  4.0  CL 93*  --  100  CO2 26  --  23  GLUCOSE 142*  --  152*  BUN 21*  --  13  CREATININE 0.89   < > 0.68  CALCIUM 8.1*  --  8.0*  MG  --   --  2.3  AST 70*  --   --   ALT 58*  --   --   ALKPHOS 107  --   --   BILITOT 1.0  --   --    < > = values in this interval not displayed.    Assessment and Plan  Andrew Khan is a 48 y.o. male with no significant past medical history presented to the hospital with right-sided lower chest pain, 2 weeks duration and acute onset of shortness of breath that started on the day of admission.   He said he had injury to the right side of his rib and saw a chiropractor for manipulation about a week prior to admission.   In the ED, he was diaphoretic, tachypneic and tachycardic.  He subsequently became febrile and hypoxic with oxygen  saturation of 88% on room air.  Severe sepsis secondary to community-acquired pneumonia Large Parapneumonic effusion --pt presented with recurrent fever, leukocytosis, large right pleural effusion  --6/10--S/p right thoracentesis with removal of 650 mL of amber fluid on  --  Pleural fluid is exudative likely from parapneumonic effusion.   -- pleural fluid culture so far neg.  --AFB Pleural fluid negative --Continue IV Zosyn  per ID Dr Harwood Lingo --Follow up with ID and pulmonologist. ---[facility] negative--No growth --MRSA screen was negative. --Strep pneumo and Legionella urine antigen tests were negative.   --Lactic acid was 2.4, 2.6. ---WBC down from 21.5---14.6--16.6---16.1---17.3 --6/11--IR consulted for persistent large right side of pleural effusion and [place] hepatic fluid.IR dr Marne Sings recommends CT guide chest tube and Perihepatic drain tomorrow --6/12--no complaints. IR to do procedure today --6/13-->1300 cc fluid in CT today. Mucopurulent fluid in perihepatic drain. Pulm plans for TPA once fluid out put decreases.  --Hepatic fluid--Rare GPC. --d/w ID dr Vu--cont zosyn , get echo  --6/14--Surgery consult with dr pabon--recommends repeat CT abd to check for undrained pockets. TPA x1 given by Dr A --6/15-- repeat CT abdomen shows improvement  in pleural effusion and Perihepatic fluid decreasing. Received second dose of TPA today -- per surgery continue abdominal drain till TPA is completed   Elevated liver enzymes/?Perihepatic abscess -- large perihepatic fluid collection along the right hepatic lobe: Acute hepatitis panel was negative. --  Etiology of hepatic fluid collection is not clear.  Discussed with Dr. Aleskerov.  --May be related to acute right pleural effusion/pneumonia.   --Mild hyperbilirubinemia: Resolved.  Probably from sepsis.  Liver ultrasound showed steatosis.  Acute hypoxic respiratory failure: Improved.      HTN --start low dose Torpol XL  Hyperglycemia,  prediabetes: Glucose levels are better.  A1c 5.7 suggestive of prediabetes   Hyponatremia: Improved from 128-133   Hypokalemia: Repleted      Procedures: right-sided thoracentesis, right Chest tube, right perihepatic drain Family communication :none today Consults : pulmonary, ID, IR, gen surgery CODE STATUS: full DVT Prophylaxis : Lovenox  Level of care: Med-Surg Status is: Inpatient Remains inpatient appropriate because: pt will be monitored over the weekend. Will need TPA thru CT once fluid output decreases    TOTAL TIME TAKING CARE OF THIS PATIENT: 35 minutes.  >50% time spent on counselling and coordination of care  Note: This dictation was prepared with Dragon dictation along with smaller phrase technology. Any transcriptional errors that result from this process are unintentional.  Melvinia Stager M.D    [facility]   CC: Primary care physician; Patient, No Pcp Per
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
## 2024-03-01 NOTE — Plan of Care (Signed)

## 2024-03-01 NOTE — Plan of Care (Signed)
°  Problem: Fluid Volume: °Goal: Hemodynamic stability will improve °Outcome: Progressing °  °Problem: Clinical Measurements: °Goal: Diagnostic test results will improve °Outcome: Progressing °Goal: Signs and symptoms of infection will decrease °Outcome: Progressing °  °
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
## 2024-03-01 NOTE — Procedures (Signed)
 Pleural Fibrinolytic Administration Procedure Note       Provider Performing: Erskin Hearing MD   Procedure: Pleural Fibrinolysis subsequent day (16109)   Indication(s) Fibrinolysis of complicated pleural effusion   Consent Risks of the procedure as well as the alternatives and risks of each were explained to the patient and/or caregiver.  Consent for the procedure was obtained.   Anesthesia None   Time Out Verified patient identification, verified procedure, site/side was marked, verified correct patient position, special equipment/implants available, medications/allergies/relevant history reviewed, required imaging and test results available.   Sterile Technique Hand hygiene, gloves   Procedure Description Existing pleural catheter was cleaned and accessed in sterile manner.  10mg  of tPA and 5mg  Dornase in 30cc of saline were injected into pleural space using existing pleural catheter.  Catheter will be clamped for 1 hour and then placed back to suction.    - continue tPA 10mg  + DNase 5mg  BID x6 total doses  STEPWISE INSTRUCTIONS:     1) turn chest tube stopcock OFF to external atrium and sterilize flush port with alcohol swab     2) flush tPA 10mg  toward patient, then DNase 5mg  toward patient, then saline 10mg  toward patient     3) turn chest tube stopcock OFF to patient (to allow tPA + DNase to dwell in pleural space) x1hr     4) after 1hr, turn stopcock OFF to flush port, allowing drainage from patient into external atrium     - please monitor chest tube output; inform IP if quantity and/or quality changes - obtain daily AM CXR PA+Lat to assess interval change in effusion size - obtain CT chest non-contrast after 6th dose of tPA + DNase in order to evaluate 1) success of therapy and 2) underlying lung parenchyma for evidence of disease (eg suspect lesions) - maintain active type and screen while TPA-DNase is being placed   Complications/Tolerance None; patient tolerated  the procedure well.   EBL None   Specimen(s) None     Erskin Hearing, M.D.  Pulmonary & Critical Care Medicine  [facility]

## 2024-03-02 DIAGNOSIS — A419 Sepsis, unspecified organism: Secondary | ICD-10-CM | POA: Diagnosis not present

## 2024-03-02 DIAGNOSIS — K650 Generalized (acute) peritonitis: Secondary | ICD-10-CM | POA: Diagnosis not present

## 2024-03-02 DIAGNOSIS — K750 Abscess of liver: Secondary | ICD-10-CM | POA: Diagnosis not present

## 2024-03-02 MED ORDER — SODIUM CHLORIDE (PF) 0.9 % IJ SOLN
10.0000 mg | Freq: Once | INTRAMUSCULAR | Status: AC
  Administered 2024-03-02: 10 mg via INTRAPLEURAL
  Filled 2024-03-02: qty 10

## 2024-03-02 MED ORDER — SODIUM CHLORIDE 0.9% FLUSH
10.0000 mL | Freq: Three times a day (TID) | INTRAVENOUS | Status: DC
  Administered 2024-03-02: 10 mL via INTRAPLEURAL

## 2024-03-02 MED ORDER — STERILE WATER FOR INJECTION IJ SOLN
5.0000 mg | Freq: Once | RESPIRATORY_TRACT | Status: AC
  Administered 2024-03-02: 5 mg via INTRAPLEURAL
  Filled 2024-03-02: qty 5

## 2024-03-02 NOTE — Procedures (Signed)
 Pleural Fibrinolytic Administration Procedure Note       Provider Performing: Erskin Hearing MD   Procedure: Pleural Fibrinolysis subsequent day (19147)   Indication(s) Fibrinolysis of complicated pleural effusion   Consent Risks of the procedure as well as the alternatives and risks of each were explained to the patient and/or caregiver.  Consent for the procedure was obtained.   Anesthesia None   Time Out Verified patient identification, verified procedure, site/side was marked, verified correct patient position, special equipment/implants available, medications/allergies/relevant history reviewed, required imaging and test results available.   Sterile Technique Hand hygiene, gloves   Procedure Description Existing pleural catheter was cleaned and accessed in sterile manner.  10mg  of tPA and 5mg  Dornase in 30cc of saline were injected into pleural space using existing pleural catheter.  Catheter will be clamped for 1 hour and then placed back to suction.    STEPWISE INSTRUCTIONS:     1) turn chest tube stopcock OFF to external atrium and sterilize flush port with alcohol swab     2) flush tPA 10mg  toward patient, then DNase 5mg  toward patient, then saline 10mg  toward patient     3) turn chest tube stopcock OFF to patient (to allow tPA + DNase to dwell in pleural space) x1hr     4) after 1hr, turn stopcock OFF to flush port, allowing drainage from patient into external atrium    - please monitor chest tube output; inform IP if quantity and/or quality changes - obtain daily AM CXR PA+Lat to assess interval change in effusion size - obtain CT chest non-contrast after 3rd day of tPA + DNase in order to evaluate 1) success of therapy and 2) underlying lung parenchyma for evidence of disease (eg suspect lesions) - maintain active type and screen while TPA-DNase is being placed   Complications/Tolerance None; patient tolerated the procedure well.   EBL None   Specimen(s) None      Erskin Hearing, M.D.  Pulmonary & Critical Care Medicine  [facility]

## 2024-03-02 NOTE — Plan of Care (Signed)
°  Problem: Fluid Volume: °Goal: Hemodynamic stability will improve °Outcome: Progressing °  °Problem: Clinical Measurements: °Goal: Diagnostic test results will improve °Outcome: Progressing °Goal: Signs and symptoms of infection will decrease °Outcome: Progressing °  °
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
## 2024-03-02 NOTE — Progress Notes (Signed)
 [facility]  - [place] [facility]   PATIENT NAME: Andrew Khan    MR#:  [id number]  DATE OF BIRTH:  [birth year]  SUBJECTIVE:  No family at bedside. Patient sitting  in the bed  S/p Chest tube and perihepatic drain placement on the right ambulating in the room by himself. No fever.  Pt is want ing to know when the drains will come out and when can he go home   VITALS:  Blood pressure 132/88, pulse 98, temperature 98.5 F (36.9 C), resp. rate 12, height 5' 10 (1.778 m), weight 86.3 kg, SpO2 95%.  PHYSICAL EXAMINATION:   GENERAL:  48 y.o.-year-old patient with no acute distress.  LUNGS: decreased breath sounds bilaterally right more than left Right CT+ CARDIOVASCULAR: S1, S2 normal. No murmur   ABDOMEN: Soft, nontender, nondistended. Right flank drain+ EXTREMITIES: No  edema b/l.    NEUROLOGIC: nonfocal  patient is alert and awake  LABORATORY PANEL:  CBC Recent Labs  Lab 03/01/24 0627  WBC 12.7*  HGB 13.9  HCT 42.0  PLT 473*    Chemistries  Recent Labs  Lab 02/25/24 0507 02/27/24 0459 02/29/24 0422  NA 131*  --  133*  K 3.3*  --  4.0  CL 93*  --  100  CO2 26  --  23  GLUCOSE 142*  --  152*  BUN 21*  --  13  CREATININE 0.89   < > 0.68  CALCIUM 8.1*  --  8.0*  MG  --   --  2.3  AST 70*  --   --   ALT 58*  --   --   ALKPHOS 107  --   --   BILITOT 1.0  --   --    < > = values in this interval not displayed.    Assessment and Plan  Andrew Khan is a 48 y.o. male with no significant past medical history presented to the hospital with right-sided lower chest pain, 2 weeks duration and acute onset of shortness of breath that started on the day of admission.   He said he had injury to the right side of his rib and saw a chiropractor for manipulation about a week prior to admission.   In the ED, he was diaphoretic, tachypneic and tachycardic.  He subsequently became febrile and hypoxic with oxygen saturation of 88% on room  air.  Severe sepsis secondary to community-acquired pneumonia Large Parapneumonic effusion --pt presented with recurrent fever, leukocytosis, large right pleural effusion  --6/10--S/p right thoracentesis with removal of 650 mL of amber fluid on  --  Pleural fluid is exudative likely from parapneumonic effusion.   -- pleural fluid culture so far neg.  --AFB Pleural fluid negative --Continue IV Zosyn  per ID Dr Harwood Lingo --Follow up with ID and pulmonologist. ---[facility] negative--No growth --MRSA screen was negative. --Strep pneumo and Legionella urine antigen tests were negative.   --Lactic acid was 2.4, 2.6. ---WBC down from 21.5---14.6--16.6---16.1---17.3 --6/11--IR consulted for persistent large right side of pleural effusion and [place] hepatic fluid.IR dr Marne Sings recommends CT guide chest tube and Perihepatic drain tomorrow --6/12--no complaints. IR to do procedure today --6/13-->1300 cc fluid in CT today. Mucopurulent fluid in perihepatic drain. Pulm plans for TPA once fluid out put decreases.  --Hepatic fluid--Rare GPC. --d/w ID dr Vu--cont zosyn , get echo  --6/14--Surgery consult with dr pabon--recommends repeat CT abd to check for undrained pockets. TPA x1 given by Dr A --6/15-- repeat CT abdomen shows improvement  in pleural effusion and Perihepatic fluid decreasing. Received second dose of TPA today -- per surgery continue abdominal drain till TPA is completed --6/16--TPA x3 rd dose today given. IR to evaluate fro drain removal   Elevated liver enzymes/?Perihepatic abscess -- large perihepatic fluid collection along the right hepatic lobe: Acute hepatitis panel was negative. --  Etiology of hepatic fluid collection is not clear.  Discussed with Dr. Aleskerov.  --May be related to acute right pleural effusion/pneumonia.   --Mild hyperbilirubinemia: Resolved.  Probably from sepsis.  Liver ultrasound showed steatosis.  Acute hypoxic respiratory failure: Improved.      HTN --start  low dose Torpol XL  Hyperglycemia, prediabetes: Glucose levels are better.  A1c 5.7 suggestive of prediabetes   Hyponatremia: Improved from 128-133   Hypokalemia: Repleted      Procedures: right-sided thoracentesis, right Chest tube, right perihepatic drain Family communication :none today Consults : pulmonary, ID, IR, gen surgery CODE STATUS: full DVT Prophylaxis : Lovenox  Level of care: Med-Surg Status is: Inpatient Remains inpatient appropriate because: pt will be monitored over the weekend. Will need TPA thru CT once fluid output decreases    TOTAL TIME TAKING CARE OF THIS PATIENT: 35 minutes.  >50% time spent on counselling and coordination of care  Note: This dictation was prepared with Dragon dictation along with smaller phrase technology. Any transcriptional errors that result from this process are unintentional.  Melvinia Stager M.D    [facility]   CC: Primary care physician; Patient, No Pcp Per
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
## 2024-03-03 ENCOUNTER — Other Ambulatory Visit: Payer: Self-pay

## 2024-03-03 ENCOUNTER — Inpatient Hospital Stay

## 2024-03-03 MED ORDER — METOPROLOL SUCCINATE ER 25 MG PO TB24: 25.0000 mg | ORAL_TABLET | Freq: Every day | ORAL | 1 refills | Status: AC

## 2024-03-03 MED ORDER — AMOXICILLIN-POT CLAVULANATE 875-125 MG PO TABS: 1.0000 | ORAL_TABLET | Freq: Two times a day (BID) | ORAL | 0 refills | Status: AC

## 2024-03-03 MED ORDER — AMOXICILLIN-POT CLAVULANATE 875-125 MG PO TABS: 1.0000 | ORAL_TABLET | Freq: Two times a day (BID) | ORAL | Status: DC

## 2024-03-03 MED ORDER — SALINE FLUSH 0.9 % IV SOLN
10.0000 mL | Freq: Every day | INTRAVENOUS | 0 refills | Status: AC
  Filled 2024-03-03: qty 300, 30d supply, fill #0

## 2024-03-03 MED ORDER — IOHEXOL 300 MG/ML  SOLN
100.0000 mL | Freq: Once | INTRAMUSCULAR | Status: AC | PRN
  Administered 2024-03-03: 100 mL via INTRAVENOUS

## 2024-03-03 NOTE — Procedures (Signed)
  IR PROCEDURE NOTE:  Patient cleared by Pulmonary service, Dr. Jaclynn Mast, for chest tube removal today, after CT imaging. Chest tube was pulled without concerns. Occlusive dressing placed, and patient and significant other were given routine care instructions. Patient and significant other were also taught drain flushing and care for RUQ abdominal drain.    Electronically Signed: Lovena Rubinstein, PA-C 03/03/2024, 1:35 PM

## 2024-03-03 NOTE — Discharge Summary (Signed)
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 Mild decrease in size of a large right pleural effusion with no pneumothorax seen. Clear left lung. Poor inspiration with a grossly normal sized heart. Unremarkable bones. IMPRESSION: Mild decrease in size of a large right pleural effusion with no pneumothorax seen. Electronically Signed   By: Catherin Closs M.D.   On: 02/25/2024 11:49   CT CHEST W CONTRAST Result Date: 02/24/2024 CLINICAL DATA:  Pneumonia, complication suspected, xray done EXAM: CT CHEST WITH CONTRAST TECHNIQUE: Multidetector CT imaging of the chest was performed during intravenous contrast administration. RADIATION DOSE REDUCTION: This exam was performed according to the departmental dose-optimization program which includes automated exposure control, adjustment of the mA and/or  kV according to patient size and/or use of iterative reconstruction technique. CONTRAST:  75mL OMNIPAQUE  IOHEXOL  300 MG/ML  SOLN COMPARISON:  February 23, 2024, February 20, 2024 FINDINGS: Cardiovascular: No cardiomegaly. Trace pericardial effusion. No aortic aneurysm. While the exam was not optimized for the evaluation of the pulmonary arteries, no central  pulmonary embolism visualized. Mediastinum/Nodes: No mediastinal mass. Subcentimeter multistation mediastinal lymph nodes measuring up to 8 mm, slightly more prominent than on the prior study. Additionally, a couple of right cardiophrenic lymph nodes are also more prominent measuring 7 mm (axial 83). These are all likely reactive. Lungs/Pleura: The midline trachea and bronchi are patent. Similar right pleural effusion, which is now large in volume, progressed since February 20, 2024. Near complete atelectasis of the right middle and right lower lobes. No pneumothorax. Trace left pleural effusion. Subsegmental atelectasis in the lingula and anterior basal left lower lobe. Patchy peripheral and subpleural based ground-glass airspace opacities in the anterior left upper lobe (axial 29 and 38), are new. Musculoskeletal: No acute fracture or destructive bone lesion. Small volume symmetric bilateral gynecomastia. Upper Abdomen: Interval development of a large amount of perihepatic fluid along the right hepatic lobe, measuring 15.7 cm in AP dimension and 5.8 cm thick (axial 108). A smaller amount of fluid along the posteromedial right hepatic lobe measures 3.8 cm. IMPRESSION: 1. Large right pleural effusion, significantly increased since February 20, 2024. Multifocal airspace consolidation in the right middle and right lower lobes, likely complete atelectasis. Alternatively, this could represent changes of a worsening multilobar pneumonia. 2. A couple of new peripheral and subpleural patchy ground-glass opacities in the anterior left upper lobe (axial 29, 38). These have the appearance of either bronchopneumonia or small pulmonary infarcts. In the absence of cavitation, septic emboli are felt to be less likely. While no central pulmonary embolus was visualized, the subsegmental branches of the pulmonary arteries are not well evaluated due to the phase of the contrast bolus. 3. Interval development of a large perihepatic fluid  collection along the right hepatic lobe, measuring 15.7 in AP dimension and 5.8 cm thick. A smaller amount of fluid along the posteromedial right hepatic lobe is also present. These are favored to represent subcapsular fluid collections. These results will be called to the ordering clinician or representative by the Radiologist Assistant and communication documented in the PACS or [employer]. Electronically Signed   By: Rance Burrows M.D.   On: 02/24/2024 19:26   DG Chest 2 View Addendum Date: 02/24/2024 ADDENDUM REPORT: 02/24/2024 17:56 ADDENDUM: Change the impression to read large right-sided pleural effusion. Electronically Signed   By: Sydell Eva M.D.   On: 02/24/2024 17:56   Result Date: 02/24/2024 CLINICAL DATA:  Pneumonia EXAM: CHEST - 2 VIEW COMPARISON:  02/20/2024 FINDINGS: Large right-sided pleural effusion. Linear basilar opacities consistent with subsegmental atelectasis. Normal pulmonary vasculature. No pneumothorax. Cardiac silhouette is obscured. IMPRESSION: No active cardiopulmonary disease. Electronically Signed: By: Sydell Eva M.D. On: 02/23/2024 09:13   US  ABDOMEN LIMITED RUQ (LIVER/GB) Result Date: 02/20/2024 CLINICAL DATA:  Abdominal pain. EXAM: ULTRASOUND ABDOMEN LIMITED RIGHT UPPER QUADRANT COMPARISON:  Abdominal ultrasound 04/07/2007. CT abdomen and pelvis 01/14/2008. FINDINGS: Gallbladder: No gallstones or wall thickening visualized. No sonographic Murphy sign noted by sonographer. Common bile duct: Diameter: 2.3 mm Liver: No focal lesion identified. Increase in parenchymal echogenicity. Portal vein is patent on color Doppler imaging with normal direction of blood flow towards the liver. Other: There is a small amount of free fluid in the right upper quadrant.  IMPRESSION: 1. No cholelithiasis or sonographic evidence for acute cholecystitis. 2. Small amount of free fluid in the right upper quadrant. 3. Increased hepatic parenchymal echogenicity suggestive of  steatosis. Electronically Signed   By: Tyron Gallon M.D.   On: 02/20/2024 20:54   CT Angio Chest PE W and/or Wo Contrast Result Date: 02/20/2024 CLINICAL DATA:  Pulmonary embolus suspected with high probability. Right-sided chest pain radiating to the back starting about 1 hour ago. Shortness of breath. EXAM: CT ANGIOGRAPHY CHEST WITH CONTRAST TECHNIQUE: Multidetector CT imaging of the chest was performed using the standard protocol during bolus administration of intravenous contrast. Multiplanar CT image reconstructions and MIPs were obtained to evaluate the vascular anatomy. RADIATION DOSE REDUCTION: This exam was performed according to the departmental dose-optimization program which includes automated exposure control, adjustment of the mA and/or kV according to patient size and/or use of iterative reconstruction technique. CONTRAST:  OMNIPAQUE  IOHEXOL  350 MG/ML SOLN COMPARISON:  Chest radiograph 02/20/2024 FINDINGS: Cardiovascular: Technically adequate study with good opacification of the central and segmental pulmonary arteries. Mild motion artifact. No focal filling defects. No evidence of significant pulmonary embolus. Normal heart size. No pericardial effusions. Normal caliber thoracic aorta. No dissection. Great vessel origins are patent. Mediastinum/Nodes: Small esophageal hiatal hernia. Esophagus is decompressed. Mediastinal lymph nodes are not pathologically enlarged, likely reactive. Thyroid gland is unremarkable. Lungs/Pleura: Atelectasis or infiltration in both lung bases, possibly compressive atelectasis or pneumonia. 5 mm nodule in the right lower lung adjacent to the fissure probably representing inter fissural lymph node. Small right pleural effusion. No pneumothorax. Upper Abdomen: Small amount of free fluid around the liver edge, likely ascites. No acute abnormality is otherwise identified. Musculoskeletal: No chest wall abnormality. No acute or significant osseous findings. Review of  the MIP images confirms the above findings. IMPRESSION: 1. No evidence of significant pulmonary embolus. 2. Infiltration or atelectasis in the lung bases. Small right pleural effusion. 3. Small esophageal hiatal hernia. 4. Minimal ascites around the liver edge. Electronically Signed   By: Boyce Byes M.D.   On: 02/20/2024 18:47   DG Chest Port 1 View Result Date: 02/20/2024 CLINICAL DATA:  Chest pain. Right-sided chest pain radiating to the back starting about our ago. Shortness of breath and sweating. EXAM: PORTABLE CHEST 1 VIEW COMPARISON:  02/13/2024 FINDINGS: Mild cardiac enlargement. Shallow inspiration. Linear atelectasis or infiltration in the lung bases, new since prior study. No pleural effusion or pneumothorax. Mediastinal contours appear intact. IMPRESSION: Shallow inspiration with atelectasis or infiltration in the lung bases, new since prior study. Mild cardiac enlargement. Electronically Signed   By: Boyce Byes M.D.   On: 02/20/2024 17:04   DG Ribs Unilateral W/Chest Right Result Date: 02/13/2024 CLINICAL DATA:  Palpable knot over the right ribs. EXAM: RIGHT RIBS AND CHEST - 3+ VIEW COMPARISON:  Chest radiograph dated 01/03/2008. FINDINGS: No fracture or other bone lesions are seen involving the ribs. There is no evidence of pneumothorax or pleural effusion. Both lungs are clear. Heart size and mediastinal contours are within normal limits. IMPRESSION: Negative. Electronically Signed   By: Angus Bark M.D.   On: 02/13/2024 10:21    Microbiology: Results for orders placed or performed during the hospital encounter of 02/20/24  Resp panel by RT-PCR (RSV, Flu A&B, Covid) Anterior Nasal Swab     Status: None   Collection Time: 02/20/24  6:57 PM   Specimen: Anterior Nasal Swab  Result Value Ref Range Status   SARS Coronavirus 2 by RT PCR NEGATIVE NEGATIVE  Final    Comment: (NOTE) SARS-CoV-2 target nucleic acids are NOT DETECTED.  The SARS-CoV-2 RNA is generally detectable  in upper respiratory specimens during the acute phase of infection. The lowest concentration of SARS-CoV-2 viral copies this assay can detect is 138 copies/mL. A negative result does not preclude SARS-Cov-2 infection and should not be used as the sole basis for treatment or other patient management decisions. A negative result may occur with  improper specimen collection/handling, submission of specimen other than nasopharyngeal swab, presence of viral mutation(s) within the areas targeted by this assay, and inadequate number of viral copies(<138 copies/mL). A negative result must be combined with clinical observations, patient history, and epidemiological information. The expected result is Negative.  Fact Sheet for Patients:  BloggerCourse.com  Fact Sheet for Healthcare Providers:  SeriousBroker.it  This test is no t yet approved or cleared by the [place]  FDA and  has been authorized for detection and/or diagnosis of SARS-CoV-2 by FDA under an Emergency Use Authorization (EUA). This EUA will remain  in effect (meaning this test can be used) for the duration of the COVID-19 declaration under Section 564(b)(1) of the Act, 21 U.S.C.section 360bbb-3(b)(1), unless the authorization is terminated  or revoked sooner.       Influenza A by PCR NEGATIVE NEGATIVE Final   Influenza B by PCR NEGATIVE NEGATIVE Final    Comment: (NOTE) The Xpert Xpress SARS-CoV-2/FLU/RSV plus assay is intended as an aid in the diagnosis of influenza from Nasopharyngeal swab specimens and should not be used as a sole basis for treatment. Nasal washings and aspirates are unacceptable for Xpert Xpress SARS-CoV-2/FLU/RSV testing.  Fact Sheet for Patients: BloggerCourse.com  Fact Sheet for Healthcare Providers: SeriousBroker.it  This test is not yet approved or cleared by the [place]  FDA and has  been authorized for detection and/or diagnosis of SARS-CoV-2 by FDA under an Emergency Use Authorization (EUA). This EUA will remain in effect (meaning this test can be used) for the duration of the COVID-19 declaration under Section 564(b)(1) of the Act, 21 U.S.C. section 360bbb-3(b)(1), unless the authorization is terminated or revoked.     Resp Syncytial Virus by PCR NEGATIVE NEGATIVE Final    Comment: (NOTE) Fact Sheet for Patients: BloggerCourse.com  Fact Sheet for Healthcare Providers: SeriousBroker.it  This test is not yet approved or cleared by the [place]  FDA and has been authorized for detection and/or diagnosis of SARS-CoV-2 by FDA under an Emergency Use Authorization (EUA). This EUA will remain in effect (meaning this test can be used) for the duration of the COVID-19 declaration under Section 564(b)(1) of the Act, 21 U.S.C. section 360bbb-3(b)(1), unless the authorization is terminated or revoked.  Performed at [facility], [address] [place], [place] [address]   Blood culture (routine x 2)     Status: None   Collection Time: 02/20/24  6:57 PM   Specimen: BLOOD  Result Value Ref Range Status   Specimen Description BLOOD RIGHT FA  Final   Special Requests   Final    BOTTLES DRAWN AEROBIC AND ANAEROBIC Blood Culture results may not be optimal due to an inadequate volume of blood received in culture bottles   Culture   Final    NO GROWTH 5 DAYS Performed at [facility], [address] [place], [place] [address]    Report Status 02/25/2024 FINAL  Final  Blood culture (routine x 2)     Status: None   Collection Time: 02/20/24  6:57 PM  Specimen: BLOOD  Result Value Ref Range Status   Specimen Description BLOOD RIGTH [facility]  Final   Special Requests   Final    BOTTLES DRAWN AEROBIC AND ANAEROBIC Blood Culture results may not be optimal due to an inadequate volume of blood received in  culture bottles   Culture   Final    NO GROWTH 5 DAYS Performed at [facility], [address], [place], [place] [address]    Report Status 02/25/2024 FINAL  Final  C Difficile Quick Screen w PCR reflex     Status: None   Collection Time: 02/21/24  6:30 PM   Specimen: STOOL  Result Value Ref Range Status   C Diff antigen NEGATIVE NEGATIVE Final   C Diff toxin NEGATIVE NEGATIVE Final   C Diff interpretation No C. difficile detected.  Final    Comment: Performed at [facility], [address] [place], [place] [address]  MRSA Next Gen by PCR, Nasal     Status: None   Collection Time: 02/22/24  6:15 PM   Specimen: Nasal Mucosa; Nasal Swab  Result Value Ref Range Status   MRSA by PCR Next Gen NOT DETECTED NOT DETECTED Final    Comment: (NOTE) The GeneXpert MRSA Assay (FDA approved for NASAL specimens only), is one component of a comprehensive MRSA colonization surveillance program. It is not intended to diagnose MRSA infection nor to guide or monitor treatment for MRSA infections. Test performance is not FDA approved in patients less than 77 years old. Performed at [facility], [address] [place], [place] [address]   Culture, blood (Routine X 2) w Reflex to ID Panel     Status: None   Collection Time: 02/24/24  5:00 PM   Specimen: BLOOD  Result Value Ref Range Status   Specimen Description BLOOD RIGHT ANTECUBITAL  Final   Special Requests   Final    BOTTLES DRAWN AEROBIC AND ANAEROBIC Blood Culture adequate volume   Culture   Final    NO GROWTH 5 DAYS Performed at [facility], [address] [place], [place] [address]    Report Status 02/29/2024 FINAL  Final  Culture, blood (Routine X 2) w Reflex to ID Panel     Status: None   Collection Time: 02/24/24  5:15 PM   Specimen: BLOOD  Result Value Ref Range Status   Specimen Description BLOOD BLOOD RIGHT HAND  Final   Special Requests   Final    BOTTLES DRAWN AEROBIC AND ANAEROBIC  Blood Culture adequate volume   Culture   Final    NO GROWTH 5 DAYS Performed at [facility], [address] [place], [place] [address]    Report Status 02/29/2024 FINAL  Final  Fungus Culture With Stain     Status: None (Preliminary result)   Collection Time: 02/25/24 11:30 AM   Specimen: PATH Cytology Pleural fluid  Result Value Ref Range Status   Fungus Stain Final report  Final    Comment: (NOTE) Performed At: [facility] [address] [place], [place] [id number] Pearlean Botts MD [id number]    Fungus (Mycology) Culture PENDING  Incomplete   Fungal Source PLEURAL  Final    Comment: Performed at [facility], [address] [place], [place] [address]  Acid Fast Smear (AFB)     Status: None   Collection Time: 02/25/24 11:30 AM   Specimen: PATH Cytology Pleural fluid  Result Value Ref Range Status   AFB Specimen Processing Concentration  Final   Acid Fast Smear Negative  Final    Comment: (NOTE) Performed At: [facility] [address] [place], [place] [id number] Pearlean Botts MD [id number]    Source (AFB) PLEURAL  Final    Comment: Performed at [facility], [address] [place], [place] [address]  Body fluid culture w Gram Stain     Status: None   Collection Time: 02/25/24 11:30 AM   Specimen: PATH Cytology Pleural fluid  Result Value Ref Range Status   Specimen Description   Final    PLEURAL Performed at [facility], [address] [place], [place] [address]    Special Requests   Final    NONE Performed at [facility], [address] [place], [place] [address]    Gram Stain   Final    ABUNDANT WBC PRESENT,BOTH PMN AND MONONUCLEAR NO ORGANISMS SEEN    Culture   Final    NO GROWTH 3 DAYS Performed at [facility], [address] [place], [place] [address]    Report Status 02/28/2024 FINAL  Final  Fungus Culture Result     Status: None   Collection Time: 02/25/24 11:30  AM  Result Value Ref Range Status   Result 1 Comment  Final    Comment: (NOTE) KOH/Calcofluor preparation:  no fungus observed. Performed At: [facility] [address] [place], [place] [id number] Pearlean Botts MD [id number]   Aerobic/Anaerobic Culture w Gram Stain (surgical/deep wound)     Status: None   Collection Time: 02/27/24  2:33 PM   Specimen: Abscess  Result Value Ref Range Status   Specimen Description   Final    ABSCESS Performed at [facility], [address] [place], [place] [address]    Special Requests   Final    RUQ FLUID Performed at [facility], [address] [place], [place] [address]    Gram Stain   Final    ABUNDANT WBC PRESENT, PREDOMINANTLY PMN RARE GRAM POSITIVE COCCI    Culture   Final    FEW BACTEROIDES FRAGILIS BETA LACTAMASE POSITIVE Performed at [facility], [address], [place], [place] [address]    Report Status 03/01/2024 FINAL  Final    Labs: CBC: Recent Labs  Lab 02/27/24 0459 03/01/24 0627  WBC 16.4* 12.7*  HGB 13.1 13.9  HCT 39.2 42.0  MCV 83.6 84.0  PLT 318 473*   Basic Metabolic Panel: Recent Labs  Lab 02/27/24 0459 02/29/24 0422  NA  --  133*  K  --  4.0  CL  --  100  CO2  --  23  GLUCOSE  --  152*  BUN  --  13  CREATININE 0.78 0.68  CALCIUM  --  8.0*  MG  --  2.3  PHOS  --  3.7    Discharge time spent: greater than 30 minutes.  Signed: Melvinia Stager, MD [facility] 03/03/2024

## 2024-03-03 NOTE — Progress Notes (Signed)
 Cosigning for student for documentation. Denny Flack, RN

## 2024-03-03 NOTE — Plan of Care (Signed)
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
## 2024-03-04 ENCOUNTER — Other Ambulatory Visit: Payer: Self-pay | Admitting: Infectious Diseases

## 2024-03-04 DIAGNOSIS — K650 Generalized (acute) peritonitis: Secondary | ICD-10-CM

## 2024-03-04 LAB — MISC LABCORP TEST (SEND OUT): Labcorp test code: 9985

## 2024-03-09 NOTE — Procedures (Signed)
 Assisted with CT case

## 2024-03-12 ENCOUNTER — Other Ambulatory Visit

## 2024-03-17 ENCOUNTER — Ambulatory Visit
Admission: RE | Admit: 2024-03-17 | Discharge: 2024-03-17 | Disposition: A | Discharge status: Home / Self Care | Source: Ambulatory Visit | Attending: Urology

## 2024-03-17 ENCOUNTER — Ambulatory Visit
Admission: RE | Admit: 2024-03-17 | Discharge: 2024-03-17 | Disposition: A | Discharge status: Home / Self Care | Source: Ambulatory Visit | Attending: Infectious Diseases | Admitting: Infectious Diseases

## 2024-03-17 DIAGNOSIS — K650 Generalized (acute) peritonitis: Secondary | ICD-10-CM

## 2024-03-17 HISTORY — PX: IR RADIOLOGIST EVAL & MGMT: IMG5224

## 2024-03-17 MED ORDER — IOPAMIDOL (ISOVUE-300) INJECTION 61%
100.0000 mL | Freq: Once | INTRAVENOUS | Status: AC | PRN
  Administered 2024-03-17: 100 mL via INTRAVENOUS
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
## 2024-03-27 LAB — FUNGUS CULTURE WITH STAIN

## 2024-03-27 LAB — FUNGUS CULTURE RESULT

## 2024-03-27 LAB — FUNGAL ORGANISM REFLEX

## 2024-04-09 LAB — ACID FAST CULTURE WITH REFLEXED SENSITIVITIES (MYCOBACTERIA): Acid Fast Culture: NEGATIVE
